# Patient Record
Sex: Female | Born: 1980
Health system: Southern US, Community
[De-identification: ages and names within clinical notes are randomized; demographics above are authoritative.]

## PROBLEM LIST (undated history)

## (undated) DIAGNOSIS — K838 Other specified diseases of biliary tract: Secondary | ICD-10-CM

## (undated) DIAGNOSIS — N200 Calculus of kidney: Secondary | ICD-10-CM

## (undated) DIAGNOSIS — K5909 Other constipation: Secondary | ICD-10-CM

## (undated) DIAGNOSIS — N289 Disorder of kidney and ureter, unspecified: Secondary | ICD-10-CM

## (undated) DIAGNOSIS — Z87442 Personal history of urinary calculi: Secondary | ICD-10-CM

## (undated) DIAGNOSIS — K635 Polyp of colon: Secondary | ICD-10-CM

## (undated) HISTORY — DX: Other constipation: K59.09

## (undated) HISTORY — DX: Other specified diseases of biliary tract: K83.8

## (undated) HISTORY — PX: BUNIONECTOMY: SHX129

## (undated) HISTORY — DX: Calculus of kidney: N20.0

## (undated) HISTORY — DX: Polyp of colon: K63.5

---
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
## 2000-09-23 ENCOUNTER — Encounter (INDEPENDENT_AMBULATORY_CARE_PROVIDER_SITE_OTHER): Payer: Self-pay | Admitting: *Deleted

## 2000-10-04 ENCOUNTER — Encounter: Admission: RE | Admit: 2000-10-04 | Discharge: 2000-10-04 | Payer: Self-pay | Admitting: Family Medicine

## 2001-11-22 ENCOUNTER — Encounter: Admission: RE | Admit: 2001-11-22 | Discharge: 2001-11-22 | Payer: Self-pay | Admitting: Family Medicine

## 2004-03-24 ENCOUNTER — Other Ambulatory Visit: Admission: RE | Admit: 2004-03-24 | Discharge: 2004-03-24 | Payer: Self-pay | Admitting: *Deleted

## 2004-05-21 ENCOUNTER — Other Ambulatory Visit: Admission: RE | Admit: 2004-05-21 | Discharge: 2004-05-21 | Payer: Self-pay | Admitting: Obstetrics & Gynecology

## 2004-08-21 ENCOUNTER — Other Ambulatory Visit: Admission: RE | Admit: 2004-08-21 | Discharge: 2004-08-21 | Payer: Self-pay | Admitting: Obstetrics & Gynecology

## 2005-01-05 ENCOUNTER — Other Ambulatory Visit: Admission: RE | Admit: 2005-01-05 | Discharge: 2005-01-05 | Payer: Self-pay | Admitting: Obstetrics & Gynecology

## 2005-02-23 ENCOUNTER — Other Ambulatory Visit: Admission: RE | Admit: 2005-02-23 | Discharge: 2005-02-23 | Payer: Self-pay | Admitting: Gynecology

## 2005-07-06 ENCOUNTER — Other Ambulatory Visit: Admission: RE | Admit: 2005-07-06 | Discharge: 2005-07-06 | Payer: Self-pay | Admitting: Gynecology

## 2006-01-07 ENCOUNTER — Emergency Department (HOSPITAL_COMMUNITY): Admission: EM | Admit: 2006-01-07 | Discharge: 2006-01-07 | Payer: Self-pay | Admitting: Emergency Medicine

## 2006-10-13 ENCOUNTER — Other Ambulatory Visit: Admission: RE | Admit: 2006-10-13 | Discharge: 2006-10-13 | Payer: Self-pay | Admitting: Gynecology

## 2007-01-21 ENCOUNTER — Encounter (INDEPENDENT_AMBULATORY_CARE_PROVIDER_SITE_OTHER): Payer: Self-pay | Admitting: *Deleted

## 2007-03-30 ENCOUNTER — Other Ambulatory Visit: Admission: RE | Admit: 2007-03-30 | Discharge: 2007-03-30 | Payer: Self-pay | Admitting: Gynecology

## 2010-01-17 DIAGNOSIS — D3A029 Benign carcinoid tumor of the large intestine, unspecified portion: Secondary | ICD-10-CM

## 2010-01-17 HISTORY — DX: Benign carcinoid tumor of the large intestine, unspecified portion: D3A.029

## 2011-02-13 ENCOUNTER — Inpatient Hospital Stay (INDEPENDENT_AMBULATORY_CARE_PROVIDER_SITE_OTHER)
Admission: RE | Admit: 2011-02-13 | Discharge: 2011-02-13 | Disposition: A | Discharge status: Home / Self Care | Payer: Self-pay | Source: Ambulatory Visit | Attending: Emergency Medicine | Admitting: Emergency Medicine

## 2011-02-13 DIAGNOSIS — I889 Nonspecific lymphadenitis, unspecified: Secondary | ICD-10-CM

## 2011-05-19 ENCOUNTER — Emergency Department (HOSPITAL_COMMUNITY): Payer: Self-pay

## 2011-05-19 ENCOUNTER — Emergency Department (HOSPITAL_COMMUNITY)
Admission: EM | Admit: 2011-05-19 | Discharge: 2011-05-19 | Disposition: A | Discharge status: Home / Self Care | Payer: Self-pay | Attending: Emergency Medicine | Admitting: Emergency Medicine

## 2011-05-19 DIAGNOSIS — R109 Unspecified abdominal pain: Secondary | ICD-10-CM | POA: Insufficient documentation

## 2011-05-19 DIAGNOSIS — N201 Calculus of ureter: Secondary | ICD-10-CM | POA: Insufficient documentation

## 2011-05-19 LAB — URINALYSIS, ROUTINE W REFLEX MICROSCOPIC
Ketones, ur: NEGATIVE mg/dL
Nitrite: NEGATIVE
Protein, ur: NEGATIVE mg/dL
pH: 5.5 (ref 5.0–8.0)

## 2011-05-19 LAB — COMPREHENSIVE METABOLIC PANEL
Albumin: 3.7 g/dL (ref 3.5–5.2)
BUN: 17 mg/dL (ref 6–23)
CO2: 26 mEq/L (ref 19–32)
Calcium: 9.3 mg/dL (ref 8.4–10.5)
Chloride: 99 mEq/L (ref 96–112)
Creatinine, Ser: 0.84 mg/dL (ref 0.50–1.10)
GFR calc non Af Amer: >60 mL/min (ref >60)
Total Bilirubin: 0.2 mg/dL — ABNORMAL LOW (ref 0.3–1.2)
Total Protein: 6.8 g/dL (ref 6.0–8.3)

## 2011-05-19 LAB — DIFFERENTIAL
Basophils Absolute: 0 10*3/uL (ref 0.0–0.1)
Eosinophils Absolute: 0.2 10*3/uL (ref 0.0–0.7)
Monocytes Absolute: 0.6 10*3/uL (ref 0.1–1.0)
Neutro Abs: 5.5 10*3/uL (ref 1.7–7.7)

## 2011-05-19 LAB — URINE MICROSCOPIC-ADD ON

## 2011-05-19 LAB — CBC
HCT: 38.3 % (ref 36.0–46.0)
MCH: 29.5 pg (ref 26.0–34.0)
MCHC: 34.5 g/dL (ref 30.0–36.0)
Platelets: 239 10*3/uL (ref 150–400)
WBC: 10.4 10*3/uL (ref 4.0–10.5)

## 2011-05-19 LAB — LIPASE, BLOOD: Lipase: 17 U/L (ref 11–59)

## 2013-11-12 ENCOUNTER — Encounter (HOSPITAL_COMMUNITY): Payer: Self-pay | Admitting: Emergency Medicine

## 2013-11-12 ENCOUNTER — Emergency Department (HOSPITAL_COMMUNITY): Payer: Self-pay

## 2013-11-12 ENCOUNTER — Emergency Department (HOSPITAL_COMMUNITY)
Admission: EM | Admit: 2013-11-12 | Discharge: 2013-11-12 | Disposition: A | Discharge status: Home / Self Care | Payer: Self-pay | Attending: Emergency Medicine | Admitting: Emergency Medicine

## 2013-11-12 DIAGNOSIS — N23 Unspecified renal colic: Secondary | ICD-10-CM

## 2013-11-12 DIAGNOSIS — Z3202 Encounter for pregnancy test, result negative: Secondary | ICD-10-CM | POA: Insufficient documentation

## 2013-11-12 DIAGNOSIS — R110 Nausea: Secondary | ICD-10-CM | POA: Insufficient documentation

## 2013-11-12 DIAGNOSIS — R300 Dysuria: Secondary | ICD-10-CM | POA: Insufficient documentation

## 2013-11-12 DIAGNOSIS — Z87442 Personal history of urinary calculi: Secondary | ICD-10-CM | POA: Insufficient documentation

## 2013-11-12 HISTORY — DX: Disorder of kidney and ureter, unspecified: N28.9

## 2013-11-12 LAB — POCT I-STAT, CHEM 8
BUN: 14 mg/dL (ref 6–23)
Calcium, Ion: 1.3 mmol/L — ABNORMAL HIGH (ref 1.12–1.23)
Chloride: 102 mEq/L (ref 96–112)
Glucose, Bld: 90 mg/dL (ref 70–99)
Sodium: 141 mEq/L (ref 135–145)

## 2013-11-12 LAB — URINE MICROSCOPIC-ADD ON

## 2013-11-12 LAB — URINALYSIS, ROUTINE W REFLEX MICROSCOPIC
Glucose, UA: NEGATIVE mg/dL
Ketones, ur: 15 mg/dL — AB
Nitrite: NEGATIVE
Protein, ur: NEGATIVE mg/dL
pH: 7 (ref 5.0–8.0)

## 2013-11-12 LAB — CBC WITH DIFFERENTIAL/PLATELET
Basophils Absolute: 0 10*3/uL (ref 0.0–0.1)
Eosinophils Absolute: 0.1 10*3/uL (ref 0.0–0.7)
Eosinophils Relative: 1 % (ref 0–5)
HCT: 42.3 % (ref 36.0–46.0)
Lymphocytes Relative: 29 % (ref 12–46)
Lymphs Abs: 2.5 10*3/uL (ref 0.7–4.0)
MCH: 28.7 pg (ref 26.0–34.0)
Monocytes Relative: 5 % (ref 3–12)
Platelets: 241 10*3/uL (ref 150–400)
WBC: 8.6 10*3/uL (ref 4.0–10.5)

## 2013-11-12 LAB — PREGNANCY, URINE: Preg Test, Ur: NEGATIVE

## 2013-11-12 MED ORDER — MORPHINE SULFATE 4 MG/ML IJ SOLN: 4.0000 mg | INTRAMUSCULAR | Status: DC | PRN

## 2013-11-12 MED ORDER — HYDROMORPHONE HCL PF 1 MG/ML IJ SOLN
1.0000 mg | INTRAMUSCULAR | Status: AC | PRN
  Administered 2013-11-12 (×2): 1 mg via INTRAVENOUS
  Filled 2013-11-12 (×2): qty 1

## 2013-11-12 MED ORDER — OXYCODONE-ACETAMINOPHEN 5-325 MG PO TABS: 2.0000 | ORAL_TABLET | ORAL | Status: DC | PRN

## 2013-11-12 MED ORDER — ONDANSETRON 4 MG PO TBDP: 4.0000 mg | ORAL_TABLET | Freq: Three times a day (TID) | ORAL | Status: DC | PRN

## 2013-11-12 MED ORDER — ONDANSETRON HCL 4 MG/2ML IJ SOLN
4.0000 mg | Freq: Once | INTRAMUSCULAR | Status: AC
  Administered 2013-11-12: 4 mg via INTRAVENOUS
  Filled 2013-11-12: qty 2

## 2013-11-12 MED ORDER — TAMSULOSIN HCL 0.4 MG PO CAPS: 0.4000 mg | ORAL_CAPSULE | Freq: Every day | ORAL | Status: DC

## 2013-11-12 MED ORDER — TAMSULOSIN HCL 0.4 MG PO CAPS
0.4000 mg | ORAL_CAPSULE | Freq: Once | ORAL | Status: AC
  Administered 2013-11-12: 0.4 mg via ORAL
  Filled 2013-11-12: qty 1

## 2013-11-12 MED ORDER — SODIUM CHLORIDE 0.9 % IV BOLUS (SEPSIS)
1000.0000 mL | Freq: Once | INTRAVENOUS | Status: AC
  Administered 2013-11-12: 1000 mL via INTRAVENOUS

## 2013-11-12 MED ORDER — KETOROLAC TROMETHAMINE 30 MG/ML IJ SOLN
30.0000 mg | Freq: Once | INTRAMUSCULAR | Status: AC
  Administered 2013-11-12: 30 mg via INTRAVENOUS
  Filled 2013-11-12: qty 1

## 2013-11-12 NOTE — ED Provider Notes (Signed)
CSN: [id number]     Arrival date & time 11/12/13  1116 History   First MD Initiated Contact with Patient 11/12/13 1142     Chief Complaint  Patient presents with  . Flank Pain  . Abdominal Pain    HPI  Patient presents with left flank pain. Started yesterday evening in her left leg radiates to the left mid abdomen. Now somewhat into her groin. Had a previous episode one year ago diagnosed with ureteral stones. Had a small distal stone. She was able to pass without difficulty. His pain is quite similar. Nausea no vomiting. No fevers. Relation use urinate but cannot.  Past Medical History  Diagnosis Date  . Renal disorder     renal calculi   History reviewed. No pertinent past surgical history. No family history on file. History  Substance Use Topics  . Smoking status: Never Smoker   . Smokeless tobacco: Not on file  . Alcohol Use: No   OB History   Grav Para Term Preterm Abortions TAB SAB Ect Mult Living                 Review of Systems  Constitutional: Negative for fever, chills, diaphoresis, appetite change and fatigue.  HENT: Negative for mouth sores, sore throat and trouble swallowing.   Eyes: Negative for visual disturbance.  Respiratory: Negative for cough, chest tightness, shortness of breath and wheezing.   Cardiovascular: Negative for chest pain.  Gastrointestinal: Negative for nausea, vomiting, abdominal pain, diarrhea and abdominal distention.  Endocrine: Negative for polydipsia, polyphagia and polyuria.  Genitourinary: Positive for flank pain and difficulty urinating. Negative for dysuria, frequency and hematuria.  Musculoskeletal: Negative for gait problem.  Skin: Negative for color change, pallor and rash.  Neurological: Negative for dizziness, syncope, light-headedness and headaches.  Hematological: Does not bruise/bleed easily.  Psychiatric/Behavioral: Negative for behavioral problems and confusion.    Allergies  Amoxicillin  Home Medications    Current Outpatient Rx  Name  Route  Sig  Dispense  Refill  . ibuprofen (ADVIL,MOTRIN) 200 MG tablet   Oral   Take 800 mg by mouth every 6 (six) hours as needed for moderate pain.         Marland Kitchen ondansetron (ZOFRAN ODT) 4 MG disintegrating tablet   Oral   Take 1 tablet (4 mg total) by mouth every 8 (eight) hours as needed for nausea.   20 tablet   0   . oxyCODONE-acetaminophen (PERCOCET/ROXICET) 5-325 MG per tablet   Oral   Take 2 tablets by mouth every 4 (four) hours as needed.   30 tablet   0   . tamsulosin (FLOMAX) 0.4 MG CAPS capsule   Oral   Take 1 capsule (0.4 mg total) by mouth daily.   7 capsule   0    BP 105/73  Pulse 72  Temp(Src) 97.9 F (36.6 C) (Oral)  Resp 16  Wt 224 lb 6.4 oz (101.787 kg)  SpO2 99%  LMP 11/05/2013 Physical Exam  Constitutional: She is oriented to person, place, and time. She appears well-developed and well-nourished. No distress.  Appears uncomfortable  HENT:  Head: Normocephalic.  Eyes: Conjunctivae are normal. Pupils are equal, round, and reactive to light. No scleral icterus.  Neck: Normal range of motion. Neck supple. No thyromegaly present.  Cardiovascular: Normal rate and regular rhythm.  Exam reveals no gallop and no friction rub.   No murmur heard. Pulmonary/Chest: Effort normal and breath sounds normal. No respiratory distress. She has no wheezes. She has  no rales.  Abdominal: Soft. Bowel sounds are normal. She exhibits no distension. There is no tenderness. There is no rebound.    Musculoskeletal: Normal range of motion.       Arms: Neurological: She is alert and oriented to person, place, and time.  Skin: Skin is warm and dry. No rash noted.  Psychiatric: She has a normal mood and affect. Her behavior is normal.    ED Course  Procedures (including critical care time) Labs Review Labs Reviewed  URINALYSIS, ROUTINE W REFLEX MICROSCOPIC - Abnormal; Notable for the following:    Color, Urine AMBER (*)    APPearance  CLOUDY (*)    Specific Gravity, Urine 1.035 (*)    Hgb urine dipstick SMALL (*)    Ketones, ur 15 (*)    All other components within normal limits  POCT I-STAT, CHEM 8 - Abnormal; Notable for the following:    Calcium, Ion 1.30 (*)    All other components within normal limits  CBC WITH DIFFERENTIAL  PREGNANCY, URINE  URINE MICROSCOPIC-ADD ON   Imaging Review Ct Abdomen Pelvis Wo Contrast  11/12/2013   CLINICAL DATA:  Left flank/left lower quadrant pain  EXAM: CT ABDOMEN AND PELVIS WITHOUT CONTRAST  TECHNIQUE: Multidetector CT imaging of the abdomen and pelvis was performed following the standard protocol without intravenous contrast.  COMPARISON:  05/19/2011  FINDINGS: Lung bases are clear.  Unenhanced liver, spleen, pancreas, and adrenal glands are within normal limits.  Gallbladder is unremarkable. No intrahepatic or extrahepatic ductal dilatation.  Right kidney is within normal limits. Left kidney is notable for perinephric stranding. No renal calculi. Fullness of the left renal collecting system without frank hydronephrosis.  No evidence of abdominal aortic aneurysm.  No abdominopelvic ascites.  No suspicious abdominopelvic lymphadenopathy.  Uterus and bilateral ovaries are unremarkable.  3 mm distal left ureteral calculus just above the UVJ (series 2/image 84).  Bladder is underdistended but unremarkable.  Visualized osseous structures are within normal limits.  IMPRESSION: 3 mm distal left ureteral calculus.  Fullness of the left renal collecting system without frank hydronephrosis. Mild left perinephric stranding/edema.   Electronically Signed   By: Charline Bills M.D.   On: 11/12/2013 13:25    EKG Interpretation   None       MDM   1. Ureteral colic    CT shows small 3 mm distal ureteral stone with mild hydronephrosis. She is tolerating symptoms well after IV pain meds. She be placed on Flomax. Plan pain control anti-inflammatories antiemetics strain her urine, stay hydrated ,  urological followup if not improving.   Roney Marion, MD 11/12/13 [phone]

## 2013-11-12 NOTE — ED Notes (Signed)
Patient transported to CT 

## 2013-11-12 NOTE — ED Notes (Signed)
Pt with hx of kidney stone to ED c/o L flank pain and LLQ pain with difficulty urinating.  Sates if feels like the last time she had a kidney stone.  Denies nausea.

## 2014-02-14 ENCOUNTER — Encounter (HOSPITAL_COMMUNITY): Payer: Self-pay | Admitting: Emergency Medicine

## 2014-02-14 ENCOUNTER — Other Ambulatory Visit (HOSPITAL_COMMUNITY)
Admission: RE | Admit: 2014-02-14 | Discharge: 2014-02-14 | Disposition: A | Discharge status: Home / Self Care | Payer: Self-pay | Source: Ambulatory Visit | Attending: Family Medicine | Admitting: Family Medicine

## 2014-02-14 ENCOUNTER — Emergency Department (INDEPENDENT_AMBULATORY_CARE_PROVIDER_SITE_OTHER)
Admission: EM | Admit: 2014-02-14 | Discharge: 2014-02-14 | Disposition: A | Discharge status: Home / Self Care | Payer: Self-pay | Source: Home / Self Care | Attending: Family Medicine | Admitting: Family Medicine

## 2014-02-14 DIAGNOSIS — Z113 Encounter for screening for infections with a predominantly sexual mode of transmission: Secondary | ICD-10-CM | POA: Insufficient documentation

## 2014-02-14 DIAGNOSIS — N760 Acute vaginitis: Secondary | ICD-10-CM

## 2014-02-14 LAB — CERVICOVAGINAL ANCILLARY ONLY
Wet Prep (BD Affirm): NEGATIVE
Wet Prep (BD Affirm): NEGATIVE
Wet Prep (BD Affirm): POSITIVE — AB

## 2014-02-14 LAB — POCT URINALYSIS DIP (DEVICE)
Bilirubin Urine: NEGATIVE
Glucose, UA: NEGATIVE mg/dL
KETONES UR: NEGATIVE mg/dL
LEUKOCYTES UA: NEGATIVE
Nitrite: NEGATIVE
PROTEIN: NEGATIVE mg/dL
Specific Gravity, Urine: >=1.03 (ref 1.005–1.030)
UROBILINOGEN UA: 0.2 mg/dL (ref 0.0–1.0)
pH: 6 (ref 5.0–8.0)

## 2014-02-14 LAB — POCT PREGNANCY, URINE: PREG TEST UR: NEGATIVE

## 2014-02-14 MED ORDER — METRONIDAZOLE 500 MG PO TABS: 500.0000 mg | ORAL_TABLET | Freq: Two times a day (BID) | ORAL | Status: DC

## 2014-02-14 NOTE — ED Provider Notes (Signed)
Caitlin Ingram is a 33 y.o. female who presents to Urgent Care today for vaginal discharge. Yellowish to whitish discharge with foul odor. This has been present for 1 week. No vaginal irritation, or abdominal pain or dysuria or urinary frequency or urgency. She has frequent episodes of bacterial vaginosis. The current symptoms are consistent with prior episodes. No nausea vomiting or diarrhea.   Past Medical History  Diagnosis Date  . Renal disorder     renal calculi   History  Substance Use Topics  . Smoking status: Never Smoker   . Smokeless tobacco: Not on file  . Alcohol Use: No   ROS as above Medications: No current facility-administered medications for this encounter.   Current Outpatient Prescriptions  Medication Sig Dispense Refill  . metroNIDAZOLE (FLAGYL) 500 MG tablet Take 1 tablet (500 mg total) by mouth 2 (two) times daily.  14 tablet  3    Exam:  BP 124/86  Pulse 60  Temp(Src) 98 F (36.7 C) (Oral)  Resp 18  SpO2 99%  LMP 02/06/2014 Gen: Well NAD HEENT: EOMI,  MMM Lungs: Normal work of breathing. CTABL Heart: RRR no MRG Abd: NABS, Soft. NT, ND Exts: Brisk capillary refill, warm and well perfused.  GYN: Normal external genitalia. Normal vaginal canal with thin white discharge normal-appearing cervix.  Results for orders placed during the hospital encounter of 02/14/14 (from the past 24 hour(s))  POCT URINALYSIS DIP (DEVICE)     Status: Abnormal   Collection Time    02/14/14  1:12 PM      Result Value Ref Range   Glucose, UA NEGATIVE  NEGATIVE mg/dL   Bilirubin Urine NEGATIVE  NEGATIVE   Ketones, ur NEGATIVE  NEGATIVE mg/dL   Specific Gravity, Urine >=1.030  1.005 - 1.030   Hgb urine dipstick TRACE (*) NEGATIVE   pH 6.0  5.0 - 8.0   Protein, ur NEGATIVE  NEGATIVE mg/dL   Urobilinogen, UA 0.2  0.0 - 1.0 mg/dL   Nitrite NEGATIVE  NEGATIVE   Leukocytes, UA NEGATIVE  NEGATIVE  POCT PREGNANCY, URINE     Status: None   Collection Time    02/14/14   1:15 PM      Result Value Ref Range   Preg Test, Ur NEGATIVE  NEGATIVE   No results found.  Assessment and Plan: 33 y.o. female with vaginitis. Likely BV. Treat with flagyl. Cytology pending.    Discussed warning signs or symptoms. Please see discharge instructions. Patient expresses understanding.    Gregor Hams, MD 02/14/14 [phone]

## 2014-02-14 NOTE — Discharge Instructions (Signed)
Thank you for coming in today. Take flagyl twice daily for 1 week.  Come back as needed.  Call or go to the emergency room if you get worse, have trouble breathing, have chest pains, or palpitations.   Bacterial Vaginosis Bacterial vaginosis is a vaginal infection that occurs when the normal balance of bacteria in the vagina is disrupted. It results from an overgrowth of certain bacteria. This is the most common vaginal infection in women of childbearing age. Treatment is important to prevent complications, especially in pregnant women, as it can cause a premature delivery. CAUSES  Bacterial vaginosis is caused by an increase in harmful bacteria that are normally present in smaller amounts in the vagina. Several different kinds of bacteria can cause bacterial vaginosis. However, the reason that the condition develops is not fully understood. RISK FACTORS Certain activities or behaviors can put you at an increased risk of developing bacterial vaginosis, including:  Having a new sex partner or multiple sex partners.  Douching.  Using an intrauterine device (IUD) for contraception. Women do not get bacterial vaginosis from toilet seats, bedding, swimming pools, or contact with objects around them. SIGNS AND SYMPTOMS  Some women with bacterial vaginosis have no signs or symptoms. Common symptoms include:  Grey vaginal discharge.  A fishlike odor with discharge, especially after sexual intercourse.  Itching or burning of the vagina and vulva.  Burning or pain with urination. DIAGNOSIS  Your health care provider will take a medical history and examine the vagina for signs of bacterial vaginosis. A sample of vaginal fluid may be taken. Your health care provider will look at this sample under a microscope to check for bacteria and abnormal cells. A vaginal pH test may also be done.  TREATMENT  Bacterial vaginosis may be treated with antibiotic medicines. These may be given in the form of a  pill or a vaginal cream. A second round of antibiotics may be prescribed if the condition comes back after treatment.  HOME CARE INSTRUCTIONS   Only take over-the-counter or prescription medicines as directed by your health care provider.  If antibiotic medicine was prescribed, take it as directed. Make sure you finish it even if you start to feel better.  Do not have sex until treatment is completed.  Tell all sexual partners that you have a vaginal infection. They should see their health care provider and be treated if they have problems, such as a mild rash or itching.  Practice safe sex by using condoms and only having one sex partner. SEEK MEDICAL CARE IF:   Your symptoms are not improving after 3 days of treatment.  You have increased discharge or pain.  You have a fever. MAKE SURE YOU:   Understand these instructions.  Will watch your condition.  Will get help right away if you are not doing well or get worse. FOR MORE INFORMATION  Centers for Disease Control and Prevention, Division of STD Prevention: AppraiserFraud.fi American Sexual Health Association (ASHA): www.ashastd.org  Document Released: 11/09/2005 Document Revised: 08/30/2013 Document Reviewed: 06/21/2013 [facility] Patient Information 2014 [place].   [address] [place], [place] [phone]  [facility] [address] [place], [place] [phone]  [facility]: Delsa Bern MD [place], [place] [phone]  [facility] [place], [place] [phone]  [facility]: Dian Queen MD [address] [place], [place] [phone]  [facility]:  [place], [place] [phone]  [address] [place], [place] [phone]  [facility]: [address], [place], [place] [address] [phone]

## 2014-02-14 NOTE — ED Notes (Signed)
C/o bacterial or yeast infection States she does have a yellowish/greenish discharge with odor Denies any urinary problems

## 2014-02-15 LAB — CERVICOVAGINAL ANCILLARY ONLY
CHLAMYDIA, DNA PROBE: NEGATIVE
Neisseria Gonorrhea: NEGATIVE

## 2014-02-16 NOTE — ED Notes (Signed)
GC/Chlamydia neg., Affirm: Candida and Trich neg., Gardnerella pos. Pt. adequately treated with Flagyl. Roselyn Meier 02/16/2014

## 2017-02-02 ENCOUNTER — Encounter (HOSPITAL_COMMUNITY): Payer: Self-pay | Admitting: Emergency Medicine

## 2017-02-02 ENCOUNTER — Emergency Department (HOSPITAL_COMMUNITY)
Admission: EM | Admit: 2017-02-02 | Discharge: 2017-02-02 | Disposition: A | Discharge status: Home / Self Care | Payer: 59 | Attending: Emergency Medicine | Admitting: Emergency Medicine

## 2017-02-02 DIAGNOSIS — F17290 Nicotine dependence, other tobacco product, uncomplicated: Secondary | ICD-10-CM | POA: Diagnosis not present

## 2017-02-02 DIAGNOSIS — M542 Cervicalgia: Secondary | ICD-10-CM | POA: Diagnosis not present

## 2017-02-02 NOTE — ED Triage Notes (Signed)
Pt to ED c/o LEFT anterior neck/throat pain and swelling. Pt states she had a surgery on her neck when she was 36 years old (removed a cyst) and has felt a "knot" for 2 days. She states it doesn't affect her breathing or airway, talking in full sentences, no dysphagia or dysarthria. Pt wants to get it checked out to see if the cyst has come back. Resp e/u. No changes in voice.

## 2017-02-02 NOTE — ED Provider Notes (Signed)
[facility] DEPT Provider Note   CSN: [id number] Arrival date & time: 02/02/17  1607   By signing my name below, I, Soijett Blue, attest that this documentation has been prepared under the direction and in the presence of Daleen Bo, MD. Electronically Signed: [place], ED Scribe. 02/02/17. 6:53 PM.  History   Chief Complaint Chief Complaint  Patient presents with  . Neck Pain    HPI Caitlin Ingram is a 36 y.o. female who presents to the Emergency Department complaining of left anterior neck pain onset 2 days ago. Pt reports associated "lump" to right anterior neck x 1 week ago. Pt has not tried any medications for the relief of her symptoms. She notes that her left anterior neck pain is worsened with laying down and certain head movements. She reports that she had a cyst removal completed to her neck in 1990 and voices concern for if she has another cyst. Pt states that the area where the cyst was located was tender to the touch and she notes that the surgery was completed in [place]. She denies painful swallowing, trouble swallowing, fever, chills, vomiting, SOB, CP, dizziness, and any other symptoms. Denies medical issues or taking daily medications. She states that she works in a [employer].    The history is provided by the patient. No language interpreter was used.    Past Medical History:  Diagnosis Date  . Renal disorder    renal calculi    There are no active problems to display for this patient.   Past Surgical History:  Procedure Laterality Date  . CYST REMOVAL NECK  1990    OB History    No data available       Home Medications    Prior to Admission medications   Medication Sig Start Date End Date Taking? Authorizing Provider  metroNIDAZOLE (FLAGYL) 500 MG tablet Take 1 tablet (500 mg total) by mouth 2 (two) times daily. 02/14/14   Gregor Hams, MD    Family History No family history on file.  Social History Social History    Substance Use Topics  . Smoking status: Current Every Day Smoker    Types: Cigars  . Smokeless tobacco: Never Used  . Alcohol use No     Allergies   Amoxicillin   Review of Systems Review of Systems  Constitutional: Negative for chills and fever.  HENT: Negative for trouble swallowing.        No painful swallowing. +"Lump" to right sided neck  Respiratory: Negative for shortness of breath.   Cardiovascular: Negative for chest pain.  Gastrointestinal: Negative for vomiting.  Musculoskeletal: Positive for neck pain.  Neurological: Negative for dizziness.  All other systems reviewed and are negative.    Physical Exam Updated Vital Signs BP 137/94 (BP Location: Left Arm)   Pulse 67   Temp 98.2 F (36.8 C) (Oral)   Resp 18   Ht 5\' 6"  (1.676 m)   Wt 225 lb (102.1 kg)   LMP 01/04/2017 (Approximate)   SpO2 100%   BMI 36.32 kg/m   Physical Exam  Constitutional: She is oriented to person, place, and time. She appears well-developed and well-nourished.  HENT:  Head: Normocephalic and atraumatic.  Mouth/Throat: Uvula is midline, oropharynx is clear and moist and mucous membranes are normal. No oral lesions. No trismus in the jaw.  Eyes: Conjunctivae and EOM are normal. Pupils are equal, round, and reactive to light.  Neck: Normal range of motion and phonation normal. Neck  supple.  1 x 1.5 cm mobile lymph at the right lower SCM. 1 x 1 cm lymphnode in the area of tenderness to left upper neck. No other worrisome mass or swelling in the neck. No stridor.   Cardiovascular: Normal rate.   Pulmonary/Chest: Effort normal. No stridor. She exhibits no tenderness.  Musculoskeletal: Normal range of motion.  Neurological: She is alert and oriented to person, place, and time. She exhibits normal muscle tone.  Skin: Skin is warm and dry.  Psychiatric: She has a normal mood and affect. Her behavior is normal. Judgment and thought content normal.  Nursing note and vitals  reviewed.    ED Treatments / Results  DIAGNOSTIC STUDIES: Oxygen Saturation is 99% on RA, nl by my interpretation.    COORDINATION OF CARE: 6:46 PM Discussed treatment plan with pt at bedside which includes referral and follow up with ENT specialist, warm compresses, ibuprofen, and pt agreed to plan.   Procedures Procedures (including critical care time)  Medications Ordered in ED Medications - No data to display   Initial Impression / Assessment and Plan / ED Course  I have reviewed the triage vital signs and the nursing notes.   Medications - No data to display  No data found.   -At discharge- reevaluation with update and discussion. After initial assessment and treatment, an updated evaluation reveals patient is comfortable and has no further complaints,  findings discussed with the patient and all questions answered. Siddhi Dornbush L    Final Clinical Impressions(s) / ED Diagnoses   Final diagnoses:  Neck pain   Nonspecific neck pain with adenopathy which is nonspecific.  The patient has concern for recurrent "cyst" of neck.  At this time with short duration of symptoms, is nonspecific, I doubt recurrence of a soft tissue neck tumor.  There is no clinical evidence for brachial cleft cyst, tracheal deformity, stridor, airway impingement, or significant musculoskeletal abnormality.  The patient is stable for discharge with outpatient expectant management.  Nursing Notes Reviewed/ Care Coordinated Applicable Imaging Reviewed Interpretation of Laboratory Data incorporated into ED treatment  The patient appears reasonably screened and/or stabilized for discharge and I doubt any other medical condition or other [facility] requiring further screening, evaluation, or treatment in the ED at this time prior to discharge.  Plan: Home Medications-OTC analgesia as needed; Home Treatments-consider heat applications for comfort.; return here if the recommended treatment, does not improve the  symptoms; Recommended follow up-ENT follow-up if not better or having further concerns after 1 week.    New Prescriptions Discharge Medication List as of 02/02/2017  7:06 PM     I personally performed the services described in this documentation, which was scribed in my presence. The recorded information has been reviewed and is accurate     Daleen Bo, MD 02/04/17 1032

## 2017-02-02 NOTE — Discharge Instructions (Signed)
There is no clear cause, for the pain you are having in her neck.  Try using heat on the sore area 3 or 4 times a day and taking ibuprofen, for pain.  If you feel like the neck is swelling, or have pain that lasts more than 1 week follow-up with the ENT physician.

## 2017-06-01 ENCOUNTER — Encounter (HOSPITAL_COMMUNITY): Payer: Self-pay | Admitting: Emergency Medicine

## 2017-06-01 ENCOUNTER — Ambulatory Visit (HOSPITAL_COMMUNITY)
Admission: EM | Admit: 2017-06-01 | Discharge: 2017-06-01 | Disposition: A | Discharge status: Home / Self Care | Payer: 59 | Attending: Family Medicine | Admitting: Family Medicine

## 2017-06-01 DIAGNOSIS — R599 Enlarged lymph nodes, unspecified: Secondary | ICD-10-CM

## 2017-06-01 MED ORDER — CEPHALEXIN 500 MG PO CAPS: 500.0000 mg | ORAL_CAPSULE | Freq: Four times a day (QID) | ORAL | 0 refills | Status: DC

## 2017-06-01 NOTE — Discharge Instructions (Signed)
Return if symptoms persist past one week

## 2017-06-01 NOTE — ED Triage Notes (Signed)
Lymph nodes in neck are sore to palpation for a week.  Denies ear pain.

## 2017-06-01 NOTE — ED Provider Notes (Signed)
CSN: [id number]     Arrival date & time 06/01/17  1718 History   None    Chief Complaint  Patient presents with  . Lymphadenopathy   (Consider location/radiation/quality/duration/timing/severity/associated sxs/prior Treatment) The history is provided by the patient. No language interpreter was used.    Past Medical History:  Diagnosis Date  . Renal disorder    renal calculi   Past Surgical History:  Procedure Laterality Date  . CYST REMOVAL NECK  1990   No family history on file. Social History  Substance Use Topics  . Smoking status: Current Every Day Smoker    Types: Cigars  . Smokeless tobacco: Never Used  . Alcohol use No   OB History    No data available     Review of Systems  All other systems reviewed and are negative.   Allergies  Amoxicillin  Home Medications   Prior to Admission medications   Medication Sig Start Date End Date Taking? Authorizing Provider  ibuprofen (ADVIL,MOTRIN) 200 MG tablet Take 200 mg by mouth every 6 (six) hours as needed.   Yes [provider]  metroNIDAZOLE (FLAGYL) 500 MG tablet Take 1 tablet (500 mg total) by mouth 2 (two) times daily. 02/14/14   Gregor Hams, MD   Meds Ordered and Administered this Visit  Medications - No data to display  BP 122/83 (BP Location: Right Arm) Comment (BP Location): large cuff  Pulse (!) 6   Temp 98.5 F (36.9 C) (Oral)   Resp 18   LMP 05/09/2017   SpO2 100%  No data found.   Physical Exam  Constitutional: She appears well-developed and well-nourished.  HENT:  Head: Normocephalic.  Right Ear: External ear normal.  Mouth/Throat: Oropharynx is clear and moist.  Eyes: Conjunctivae and EOM are normal. Pupils are equal, round, and reactive to light.  Neck: Normal range of motion.  Swollen lymph node in front of right ear, tender to palpation  Cardiovascular: Normal rate.   Pulmonary/Chest: Effort normal.  Abdominal: Soft.  Musculoskeletal: Normal range of motion.   Neurological: She is alert.  Skin: Skin is warm.  Psychiatric: She has a normal mood and affect.  Nursing note and vitals reviewed.   Urgent Care Course     Procedures (including critical care time)  Labs Review Labs Reviewed - No data to display  Imaging Review No results found.   Visual Acuity Review  Right Eye Distance:   Left Eye Distance:   Bilateral Distance:    Right Eye Near:   Left Eye Near:    Bilateral Near:         MDM    1. Lymph node enlargement    An After Visit Summary was printed and given to the patient. Meds ordered this encounter  Medications  . ibuprofen (ADVIL,MOTRIN) 200 MG tablet    Sig: Take 200 mg by mouth every 6 (six) hours as needed.  . cephALEXin (KEFLEX) 500 MG capsule    Sig: Take 1 capsule (500 mg total) by mouth 4 (four) times daily.    Dispense:  40 capsule    Refill:  0    Order Specific Question:   Supervising Provider    Answer:   Shaune Spittle, PA-C 06/02/17 1117

## 2018-03-03 DIAGNOSIS — M25561 Pain in right knee: Secondary | ICD-10-CM | POA: Diagnosis not present

## 2018-03-24 DIAGNOSIS — M25561 Pain in right knee: Secondary | ICD-10-CM | POA: Diagnosis not present

## 2018-08-19 DIAGNOSIS — Z0100 Encounter for examination of eyes and vision without abnormal findings: Secondary | ICD-10-CM | POA: Diagnosis not present

## 2019-01-09 ENCOUNTER — Ambulatory Visit: Payer: 59 | Admitting: Podiatry

## 2019-01-10 ENCOUNTER — Encounter: Payer: Self-pay | Admitting: Podiatry

## 2019-01-10 ENCOUNTER — Ambulatory Visit (INDEPENDENT_AMBULATORY_CARE_PROVIDER_SITE_OTHER): Payer: 59

## 2019-01-10 ENCOUNTER — Ambulatory Visit: Payer: 59 | Admitting: Podiatry

## 2019-01-10 DIAGNOSIS — M2041 Other hammer toe(s) (acquired), right foot: Secondary | ICD-10-CM | POA: Diagnosis not present

## 2019-01-10 DIAGNOSIS — M2011 Hallux valgus (acquired), right foot: Secondary | ICD-10-CM

## 2019-01-10 DIAGNOSIS — M2042 Other hammer toe(s) (acquired), left foot: Secondary | ICD-10-CM

## 2019-01-10 DIAGNOSIS — M2012 Hallux valgus (acquired), left foot: Secondary | ICD-10-CM | POA: Diagnosis not present

## 2019-01-10 NOTE — Progress Notes (Signed)
   Subjective:    Patient ID: Caitlin Ingram, female    DOB: Nov 12, 1981, 38 y.o.   MRN: [id number]  HPI 38 year old female presents the office today for concerns of painful bunions both of her feet with the left side worse than the right as well as painful left fifth toe.  She states she previously did break her left fifth toe.  She states that she has tried changing shoes and was made the bunions worse.  She does have a strong family history of having bunions and she was to have surgery when she was younger but she never did.  The bunions have progressed.  She also states the left fifth toes become painful with certain shoes.  She is tried changing shoes without any significant improvement.  She has no other concerns today.   Review of Systems  All other systems reviewed and are negative.  Past Medical History:  Diagnosis Date  . Renal disorder    renal calculi    Past Surgical History:  Procedure Laterality Date  . CYST REMOVAL NECK  1990    No current outpatient medications on file.  Allergies  Allergen Reactions  . Amoxicillin Hives       Objective:   Physical Exam General: AAO x3, NAD  Dermatological: Skin is warm, dry and supple bilateral. Nails x 10 are well manicured; remaining integument appears unremarkable at this time. There are no open sores, no preulcerative lesions, no rash or signs of infection present.  Vascular: Dorsalis Pedis artery and Posterior Tibial artery pedal pulses are 2/4 bilateral with immedate capillary fill time.  There is no pain with calf compression, swelling, warmth, erythema.   Neruologic: Grossly intact via light touch bilateral.  Protective threshold with Semmes Wienstein monofilament intact to all pedal sites bilateral.   Musculoskeletal: Severe bunion deformities present bilaterally left side worse than the right.  There is hypermobility present of the first ray.  Mild hammertoe contractures are present there is adductovarus present  the fifth toe and causing discomfort on the PIPJ.  There is tenderness directly on the bunion sites.  Small callus formation present on the left first MPJ.  Muscular strength 5/5 in all groups tested bilateral.  Gait: Unassisted, Nonantalgic.     Assessment & Plan:  38 year old female with severe bunion deformities bilaterally hammertoe deformity causing pain to the left fifth toe -Treatment options discussed including all alternatives, risks, and complications -Etiology of symptoms were discussed -X-rays were obtained and reviewed with the patient.  There is no evidence of acute fracture.  Severe bunion deformities present.  Hammertoes are present.   Today we a long discussion regards to surgical as well as conservative care.  Regards to conservative treatments we discussed offloading, padding.  Discussed shoe modifications and orthotics.  Ultimately given the severity of her bunion she wants to consider surgery.  We discussed with her a Lapidus bunionectomy and possible fifth toe arthroplasty.  We discussed the surgery as well as postoperative course.  She will consider her options and discussed with her job.  I gave her the information to contact her surgery scheduler should she want to proceed with surgery.  In the meantime we will continue with conservative care.  Trula Slade DPM

## 2019-01-10 NOTE — Patient Instructions (Addendum)
The procedure for your bunions we talked about is called a Lapidus Bunionectomy  If was nice to meet you today. If you have any questions or any further concerns, please feel fee to give me a call. You can call our office at [phone] or please feel fee to send me a message through [place].    Bunion  A bunion is a bump on the base of the big toe that forms when the bones of the big toe joint move out of position. Bunions may be small at first, but they often get larger over time. They can make walking painful. What are the causes? A bunion may be caused by:  Wearing narrow or pointed shoes that force the big toe to press against the other toes.  Abnormal foot development that causes the foot to roll inward (pronate).  Changes in the foot that are caused by certain diseases, such as rheumatoid arthritis or polio.  A foot injury. What increases the risk? The following factors may make you more likely to develop this condition:  Wearing shoes that squeeze the toes together.  Having certain diseases, such as: ? Rheumatoid arthritis. ? Polio. ? Cerebral palsy.  Having family members who have bunions.  Being born with a foot deformity, such as flat feet or low arches.  Doing activities that put a lot of pressure on the feet, such as ballet dancing. What are the signs or symptoms? The main symptom of a bunion is a noticeable bump on the big toe. Other symptoms may include:  Pain.  Swelling around the big toe.  Redness and inflammation.  Thick or hardened skin on the big toe or between the toes.  Stiffness or loss of motion in the big toe.  Trouble with walking. How is this diagnosed? A bunion may be diagnosed based on your symptoms, medical history, and activities. You may have tests, such as:  X-rays. These allow your health care provider to check the position of the bones in your foot and look for damage to your joint. They also help your health care provider  determine the severity of your bunion and the best way to treat it.  Joint aspiration. In this test, a sample of fluid is removed from the toe joint. This test may be done if you are in a lot of pain. It helps rule out diseases that cause painful swelling of the joints, such as arthritis. How is this treated? Treatment depends on the severity of your symptoms. The goal of treatment is to relieve symptoms and prevent the bunion from getting worse. Your health care provider may recommend:  Wearing shoes that have a wide toe box.  Using bunion pads to cushion the affected area.  Taping your toes together to keep them in a normal position.  Placing a device inside your shoe (orthotics) to help reduce pressure on your toe joint.  Taking medicine to ease pain, inflammation, and swelling.  Applying heat or ice to the affected area.  Doing stretching exercises.  Surgery to remove scar tissue and move the toes back into their normal position. This treatment is rare. Follow these instructions at home: Managing pain, stiffness, and swelling   If directed, put ice on the painful area: ? Put ice in a plastic bag. ? Place a towel between your skin and the bag. ? Leave the ice on for 20 minutes, 2-3 times a day. Activity   If directed, apply heat to the affected area before you exercise. Use  the heat source that your health care provider recommends, such as a moist heat pack or a heating pad. ? Place a towel between your skin and the heat source. ? Leave the heat on for 20-30 minutes. ? Remove the heat if your skin turns bright red. This is especially important if you are unable to feel pain, heat, or cold. You may have a greater risk of getting burned.  Do exercises as told by your health care provider. General instructions  Support your toe joint with proper footwear, shoe padding, or taping as told by your health care provider.  Take over-the-counter and prescription medicines only as  told by your health care provider.  Keep all follow-up visits as told by your health care provider. This is important. Contact a health care provider if your symptoms:  Get worse.  Do not improve in 2 weeks. Get help right away if you have:  Severe pain and trouble with walking. Summary  A bunion is a bump on the base of the big toe that forms when the bones of the big toe joint move out of position.  Bunions can make walking painful.  Treatment depends on the severity of your symptoms.  Support your toe joint with proper footwear, shoe padding, or taping as told by your health care provider. This information is not intended to replace advice given to you by your health care provider. Make sure you discuss any questions you have with your health care provider. Document Released: 11/09/2005 Document Revised: 03/22/2018 Document Reviewed: 03/22/2018 Elsevier Interactive Patient Education  2019 [employer].

## 2019-01-11 DIAGNOSIS — M2012 Hallux valgus (acquired), left foot: Principal | ICD-10-CM | POA: Insufficient documentation

## 2019-01-11 DIAGNOSIS — M2011 Hallux valgus (acquired), right foot: Secondary | ICD-10-CM | POA: Insufficient documentation

## 2019-01-18 ENCOUNTER — Telehealth: Payer: Self-pay | Admitting: *Deleted

## 2019-01-18 NOTE — Telephone Encounter (Signed)
"  I'm calling to schedule my surgery with Dr. Jacqualyn Posey."  Have you signed your consent forms for the surgery?  "No, he just gave me a pamphlet and told me to look over it and to call once I decided what I wanted to do."  You need to schedule an appointment to see him for a consultation.  I'll transfer you to a scheduler's voicemail.  Leave her a message and she will call you tomorrow and schedule you an appointment with Dr. Jacqualyn Posey.  "Okay, that's fine."  I transferred her to [employer].

## 2019-01-20 ENCOUNTER — Telehealth: Payer: Self-pay | Admitting: *Deleted

## 2019-01-20 NOTE — Telephone Encounter (Signed)
"  I calling about scheduling an appointment for my surgery with Dr. Jacqualyn Posey."    I informed her that she needs to schedule an appointment with Dr. Jacqualyn Posey for a consult.

## 2019-01-25 ENCOUNTER — Telehealth: Payer: Self-pay | Admitting: *Deleted

## 2019-01-25 NOTE — Telephone Encounter (Signed)
"  I'm calling to schedule my surgery with Dr. Jacqualyn Posey.  He told me to give you a call whenever I was ready to have the surgery."  You need to schedule an appointment with Dr. Jacqualyn Posey for a consultation.  "I thought I already did that and all I needed to do is schedule my surgery."  Did you sign a three page consent form and get a surgical kit?  "No, I have not done that yet."  You need to schedule an appointment with Dr. Jacqualyn Posey.  "Will I have to pay another co-pay for that visit?"  Yes, most likely you will have to pay your co-pay.  Would you like me to transfer you to an appointment scheduler?  "Yes, that would great."  I transferred her to Rema Fendt so she could schedule an appointment.

## 2019-01-26 ENCOUNTER — Ambulatory Visit: Payer: 59 | Admitting: Podiatry

## 2019-01-26 ENCOUNTER — Encounter: Payer: Self-pay | Admitting: Podiatry

## 2019-01-26 VITALS — BP 128/83 | HR 76 | Resp 14

## 2019-01-26 DIAGNOSIS — M2041 Other hammer toe(s) (acquired), right foot: Secondary | ICD-10-CM | POA: Diagnosis not present

## 2019-01-26 DIAGNOSIS — M2011 Hallux valgus (acquired), right foot: Secondary | ICD-10-CM | POA: Diagnosis not present

## 2019-01-26 DIAGNOSIS — M79672 Pain in left foot: Secondary | ICD-10-CM | POA: Diagnosis not present

## 2019-01-26 DIAGNOSIS — M2012 Hallux valgus (acquired), left foot: Secondary | ICD-10-CM

## 2019-01-26 DIAGNOSIS — M2042 Other hammer toe(s) (acquired), left foot: Secondary | ICD-10-CM | POA: Diagnosis not present

## 2019-01-26 NOTE — Patient Instructions (Signed)
Pre-Operative Instructions  Congratulations, you have decided to take an important step towards improving your quality of life.  You can be assured that the doctors and staff at [facility] will be with you every step of the way.  Here are some important things you should know:  1. Plan to be at the surgery center/hospital at least 1 (one) hour prior to your scheduled time, unless otherwise directed by the surgical center/hospital staff.  You must have a responsible adult accompany you, remain during the surgery and drive you home.  Make sure you have directions to the surgical center/hospital to ensure you arrive on time. 2. If you are having surgery at [facility] or [facility] hospitals, you will need a copy of your medical history and physical form from your family physician within one month prior to the date of surgery. We will give you a form for your primary physician to complete.  3. We make every effort to accommodate the date you request for surgery.  However, there are times where surgery dates or times have to be moved.  We will contact you as soon as possible if a change in schedule is required.   4. No aspirin/ibuprofen for one week before surgery.  If you are on aspirin, any non-steroidal anti-inflammatory medications (Mobic, Aleve, Ibuprofen) should not be taken seven (7) days prior to your surgery.  You make take Tylenol for pain prior to surgery.  5. Medications - If you are taking daily heart and blood pressure medications, seizure, reflux, allergy, asthma, anxiety, pain or diabetes medications, make sure you notify the surgery center/hospital before the day of surgery so they can tell you which medications you should take or avoid the day of surgery. 6. No food or drink after midnight the night before surgery unless directed otherwise by surgical center/hospital staff. 7. No alcoholic beverages 24-hours prior to surgery.  No smoking 24-hours prior or 24-hours after  surgery. 8. Wear loose pants or shorts. They should be loose enough to fit over bandages, boots, and casts. 9. Don't wear slip-on shoes. Sneakers are preferred. 10. Bring your boot with you to the surgery center/hospital.  Also bring crutches or a walker if your physician has prescribed it for you.  If you do not have this equipment, it will be provided for you after surgery. 11. If you have not been contacted by the surgery center/hospital by the day before your surgery, call to confirm the date and time of your surgery. 12. Leave-time from work may vary depending on the type of surgery you have.  Appropriate arrangements should be made prior to surgery with your employer. 13. Prescriptions will be provided immediately following surgery by your doctor.  Fill these as soon as possible after surgery and take the medication as directed. Pain medications will not be refilled on weekends and must be approved by the doctor. 14. Remove nail polish on the operative foot and avoid getting pedicures prior to surgery. 15. Wash the night before surgery.  The night before surgery wash the foot and leg well with water and the antibacterial soap provided. Be sure to pay special attention to beneath the toenails and in between the toes.  Wash for at least three (3) minutes. Rinse thoroughly with water and dry well with a towel.  Perform this wash unless told not to do so by your physician.  Enclosed: 1 Ice pack (please put in freezer the night before surgery)   1 Hibiclens skin cleaner     Pre-op instructions  If you have any questions regarding the instructions, please do not hesitate to call our office.  [place]: [address], [place], [place] [address] -- [phone]  [place]: [address] [place], [place] [address] -- [phone]  [place]: [address]  [place], [place] [address] -- [phone]  [place]: [address], [address], [place],  [address] -- [phone]  Website:  [email or web address] 

## 2019-01-26 NOTE — Progress Notes (Signed)
Subjective: 38 year old female presents the office today for surgical consultation.  She states that both wounds are still hurting she was to go to proceed with surgery in the left side.  She also states that fifth toe is been hurting still despite offloading and padding.  Regards the bunions she has tried shoe modifications, offloading padding without any significant improvement. Denies any systemic complaints such as fevers, chills, nausea, vomiting. No acute changes since last appointment, and no other complaints at this time.   Objective: AAO x3, NAD DP/PT pulses palpable bilaterally, CRT less than 3 seconds Severe bunion deformities present bilaterally left side worse than right there is tenderness on the bunion area.  Also adductovarus is present left fifth toe causing discomfort on the PIPJ.  There is mild erythema to these areas where from inside shoes.  There is hypermobility present in the first ray.  There is no pain crepitation with MPJ range of motion. No open lesions or pre-ulcerative lesions.  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  -I again reviewed the x-rays with her and we discussed multiple options both conservative as well as surgical.  At this time she was to proceed with surgery -We will plan for a Lapidus bunionectomy left foot (likely lapiplasty) arthroplasty of the fifth digit. -The incision placement as well as the postoperative course was discussed with the patient. I discussed risks of the surgery which include, but not limited to, infection, bleeding, pain, swelling, need for further surgery, delayed or nonhealing, painful or ugly scar, numbness or sensation changes, over/under correction, recurrence, transfer lesions, further deformity, hardware failure, DVT/PE, loss of toe/foot.  Patient understands these risks and wishes to proceed with surgery. The surgical consent was reviewed with the patient all 3 pages were signed. No promises or guarantees were given to the outcome of the procedure. All questions were answered to the best of my ability. Before the surgery the patient was encouraged to call the office if there is any further questions. The surgery will be performed at the [facility] on an outpatient basis. -CAM boot dispensed for postop use   Trula Slade DPM  -Patient encouraged to call the office with any questions, concerns, change in symptoms.

## 2019-02-15 ENCOUNTER — Telehealth: Payer: Self-pay | Admitting: *Deleted

## 2019-02-15 NOTE — Telephone Encounter (Signed)
I left the patient a message to call me to reschedule her appointment that's scheduled for 02/22/2019.

## 2019-02-15 NOTE — Telephone Encounter (Signed)
"  I'm returning your call."  Yes, I was calling to reschedule your surgery that is scheduled for 02/23/2019.  Can you do it on March 08, 2019?  "Yes, that date will be fine.  Will I need to contact my HR and disability company about that?"  Yes, you will.

## 2019-02-22 ENCOUNTER — Telehealth: Payer: Self-pay | Admitting: *Deleted

## 2019-02-22 NOTE — Telephone Encounter (Signed)
I left Caitlin Ingram a message to call me back.  I informed her that we need to reschedule her appointment that's scheduled for March 08, 2019.  I offered her the date of 04/12/2019.

## 2019-02-22 NOTE — Telephone Encounter (Signed)
"  I just received your message to call you."  Yes, I need to reschedule your surgery from April 15.  "I was just getting ready to call [employer].  I'm glad you called.  When can he do it?"  He can do it on 04/12/2019.  "Okay, I'll write that down."  Be safe.  I rescheduled her surgery from 03/08/2019 to 04/12/19 via the surgical center's [facility] [employer] Portal.

## 2019-03-27 ENCOUNTER — Telehealth: Payer: Self-pay | Admitting: *Deleted

## 2019-03-27 NOTE — Telephone Encounter (Signed)
"  I was calling you because I had received a word from my employer that they had changed the date for my operation once again and I had never received notification about it.  So, I'm not even sure what the date is.  I have not been downstairs to HR yet.  But she let me know that she had received a fax concerning my changed date for my operation.  I want to also know why they are pushing the date back..  I'll try to contact you again.  I get off work at 3:30 pm.  Hopefull you will be there.  Talk to you soon."  "Did you get my message?  Yes, I just took the message out of my voicemail.  We still have you scheduled for Apr 12, 2019.  Maybe the date was wrong on your FMLA paperwork.  "Okay, I'll call HR now and tell them I'm still scheduled for Apr 12, 2019."  I'll ask Marcie Bal to make sure she put Apr 12, 2019 on your FMLA paperwork.  "I okay, thank you."

## 2019-03-31 ENCOUNTER — Telehealth: Payer: Self-pay | Admitting: *Deleted

## 2019-03-31 NOTE — Telephone Encounter (Signed)
DOS 04/12/2019, CPT CODES - 87215, 87276 - LAPIDUS WITH A BUNIONECTOMY AND HAMMER TOE REPAIR 5TH LEFT FOOT  [employer]: Active Coverage: 12/24/2018 - 11/23/2019  Individual In-Network (Calendar Year) Deductible $212.11 MET YTD  $1,848.59 remaining  $3,000.00 Plan Amt.   Out-of-Pocket $312.11 MET YTD  $6,037.89 remaining  $6,350.00 Plan Amt.  Physician Fees for Surgical and Medical Services 70% of eligible expenses after satisfying the deductible.  ------------------------------------------------------------------------------------------------------------------------------------------------------------ [place]: [id number] PENDING AUTH.

## 2019-04-07 NOTE — Telephone Encounter (Signed)
SURGERY WAS APPROVED SEE PENDING NUMBER BELOW.  [facility]  Coverage determination is reflected for the facility admission and is not a guarantee of payment for ongoing services. Covered/Approved 04/06/2019  [id number] Correction, hammertoe (eg, interphalange Covered/Approved 04/06/2019 2. [id number] Correction, hallux valgus (bunionectomy)  Covered/Approved 04/06/2019

## 2019-04-12 ENCOUNTER — Other Ambulatory Visit: Payer: Self-pay | Admitting: Podiatry

## 2019-04-12 ENCOUNTER — Encounter: Payer: Self-pay | Admitting: Podiatry

## 2019-04-12 DIAGNOSIS — M2042 Other hammer toe(s) (acquired), left foot: Secondary | ICD-10-CM | POA: Diagnosis not present

## 2019-04-12 DIAGNOSIS — M2012 Hallux valgus (acquired), left foot: Secondary | ICD-10-CM | POA: Diagnosis not present

## 2019-04-12 MED ORDER — OXYCODONE-ACETAMINOPHEN 5-325 MG PO TABS: 1.0000 | ORAL_TABLET | Freq: Four times a day (QID) | ORAL | 0 refills | Status: DC | PRN

## 2019-04-12 MED ORDER — CLINDAMYCIN HCL 300 MG PO CAPS: 300.0000 mg | ORAL_CAPSULE | Freq: Three times a day (TID) | ORAL | 0 refills | Status: DC

## 2019-04-12 MED ORDER — PROMETHAZINE HCL 25 MG PO TABS: 25.0000 mg | ORAL_TABLET | Freq: Three times a day (TID) | ORAL | 0 refills | Status: DC | PRN

## 2019-04-12 NOTE — Progress Notes (Signed)
Postop medications sent 

## 2019-04-13 ENCOUNTER — Telehealth: Payer: Self-pay | Admitting: *Deleted

## 2019-04-13 NOTE — Telephone Encounter (Signed)
Called and left a message for the patient to call me-I was calling to check on patient to see how patient was doing after surgery with Dr Jacqualyn Posey on Wednesday and left our [place] office number [phone]. Lattie Haw

## 2019-04-14 ENCOUNTER — Telehealth: Payer: Self-pay | Admitting: *Deleted

## 2019-04-14 NOTE — Telephone Encounter (Signed)
Called and spoke with the patient and the patient is doing better and I am taken my ibuprofen and the antibiotics and the block is wearing off and there is no fever or chills and no nausea and the next time I take the pain medicine is at 3 pm today and I am icing and elevating and to call the [place] office if any concerns and questions at [phone]. Caitlin Ingram

## 2019-04-17 ENCOUNTER — Telehealth: Payer: Self-pay | Admitting: Podiatry

## 2019-04-17 NOTE — Telephone Encounter (Signed)
Patient presented to the office for an appointment on [place] day and found the office closed.  She says she is doing well following her surgery.  She was told to call the office Tuesday  Morning and reschedule her appointment.  Gardiner Barefoot DPM

## 2019-04-18 NOTE — Progress Notes (Signed)
This encounter was created in error - please disregard.

## 2019-04-20 ENCOUNTER — Other Ambulatory Visit: Payer: Self-pay

## 2019-04-20 ENCOUNTER — Ambulatory Visit (INDEPENDENT_AMBULATORY_CARE_PROVIDER_SITE_OTHER): Payer: 59

## 2019-04-20 ENCOUNTER — Ambulatory Visit (HOSPITAL_COMMUNITY)
Admission: EM | Admit: 2019-04-20 | Discharge: 2019-04-20 | Disposition: A | Discharge status: Home / Self Care | Payer: 59 | Attending: Internal Medicine | Admitting: Internal Medicine

## 2019-04-20 ENCOUNTER — Encounter (HOSPITAL_COMMUNITY): Payer: Self-pay | Admitting: Family Medicine

## 2019-04-20 DIAGNOSIS — K5900 Constipation, unspecified: Secondary | ICD-10-CM

## 2019-04-20 DIAGNOSIS — K5909 Other constipation: Secondary | ICD-10-CM | POA: Diagnosis not present

## 2019-04-20 DIAGNOSIS — R109 Unspecified abdominal pain: Secondary | ICD-10-CM

## 2019-04-20 MED ORDER — MAGNESIUM CITRATE PO SOLN: 1.0000 | Freq: Once | ORAL | 1 refills | Status: AC

## 2019-04-20 NOTE — Discharge Instructions (Signed)
Nothing concerning today on abdominal x-ray. We will try the magnesium citrate to see if this helps with your symptoms As we spoke about we always worry about possible gallbladder issues with the area of pain that you are having in the referred shoulder pain. If your symptoms do not resolve with a good bowel movement or continue you will need to follow-up for ultrasound and further evaluation.

## 2019-04-20 NOTE — ED Provider Notes (Signed)
[place]    CSN: [id number] Arrival date & time: 04/20/19  2423     History   Chief Complaint Chief Complaint  Patient presents with  . Constipation    HPI Caitlin Ingram is a 38 y.o. female.   Pt is a 38 year old female that presents with constipation. This has been a problem x 1 month. Reports some diet changes and foot surgery in march. She was also taking oxycodone at that time. She is passing some stool but small amount. No rectal bleeding. She has had gas an bloating, more discomfort in the right abdominal area.  Denies any associated fever, chills, body aches, nausea, vomiting.  She has been using Epson salt laxatives without any relief.  ROS per HPI      Past Medical History:  Diagnosis Date  . Renal disorder    renal calculi    Patient Active Problem List   Diagnosis Date Noted  . Valgus deformity of both great toes 01/11/2019  . Pain in right knee 03/03/2018    Past Surgical History:  Procedure Laterality Date  . CYST REMOVAL NECK  1990    OB History   No obstetric history on file.      Home Medications    Prior to Admission medications   Medication Sig Start Date End Date Taking? Authorizing Provider  clindamycin (CLEOCIN) 300 MG capsule Take 1 capsule (300 mg total) by mouth 3 (three) times daily. 04/12/19   Trula Slade, DPM  magnesium citrate SOLN Take 296 mLs (1 Bottle total) by mouth once for 1 dose. 04/20/19 04/20/19  Orvan July, NP  promethazine (PHENERGAN) 25 MG tablet Take 1 tablet (25 mg total) by mouth every 8 (eight) hours as needed for nausea or vomiting. 04/12/19   Trula Slade, DPM    Family History No family history on file.  Social History Social History   Tobacco Use  . Smoking status: Current Every Day Smoker    Types: Cigars  . Smokeless tobacco: Never Used  Substance Use Topics  . Alcohol use: No  . Drug use: No     Allergies   Amoxicillin   Review of Systems Review of Systems    Physical Exam Triage Vital Signs ED Triage Vitals  Enc Vitals Group     BP 04/20/19 1010 138/81     Pulse Rate 04/20/19 1010 75     Resp 04/20/19 1010 16     Temp 04/20/19 1010 98.6 F (37 C)     Temp Source 04/20/19 1010 Oral     SpO2 04/20/19 1010 100 %     Weight 04/20/19 1037 196 lb (88.9 kg)     Height --      Head Circumference --      Peak Flow --      Pain Score 04/20/19 1036 6     Pain Loc --      Pain Edu? --      Excl. in [place]? --    No data found.  Updated Vital Signs BP 138/81 (BP Location: Left Arm)   Pulse 75   Temp 98.6 F (37 C) (Oral)   Resp 16   Wt 196 lb (88.9 kg)   LMP 04/08/2019 (Exact Date)   SpO2 100%   BMI 31.64 kg/m   Visual Acuity Right Eye Distance:   Left Eye Distance:   Bilateral Distance:    Right Eye Near:   Left Eye Near:  Bilateral Near:     Physical Exam Vitals signs and nursing note reviewed.  Constitutional:      General: She is not in acute distress.    Appearance: Normal appearance. She is not ill-appearing, toxic-appearing or diaphoretic.  HENT:     Head: Normocephalic and atraumatic.     Nose: Nose normal.     Mouth/Throat:     Pharynx: Oropharynx is clear.  Eyes:     Conjunctiva/sclera: Conjunctivae normal.  Neck:     Musculoskeletal: Normal range of motion.  Pulmonary:     Effort: Pulmonary effort is normal.  Abdominal:     General: There is no distension.     Palpations: Abdomen is soft.     Tenderness: There is no abdominal tenderness.  Musculoskeletal: Normal range of motion.  Skin:    General: Skin is warm and dry.     Findings: No rash.  Neurological:     Mental Status: She is alert.  Psychiatric:        Mood and Affect: Mood normal.      UC Treatments / Results  Labs (all labs ordered are listed, but only abnormal results are displayed) Labs Reviewed - No data to display  EKG None  Radiology Dg Abd 1 View  Result Date: 04/20/2019 CLINICAL DATA:  Constipation. EXAM: ABDOMEN - 1  VIEW COMPARISON:  CT abdomen pelvis dated November 12, 2013. FINDINGS: The bowel gas pattern is normal. Normal stool burden. No radio-opaque calculi or other significant radiographic abnormality are seen. No acute osseous abnormality. IMPRESSION: Negative. Electronically Signed   By: Titus Dubin M.D.   On: 04/20/2019 11:59    Procedures Procedures (including critical care time)  Medications Ordered in UC Medications - No data to display  Initial Impression / Assessment and Plan / UC Course  I have reviewed the triage vital signs and the nursing notes.  Pertinent labs & imaging results that were available during my care of the patient were reviewed by me and considered in my medical decision making (see chart for details).     X ray with normal stool burden and gas Nothing concerning.no obstruction.  Instructed that she could try mag citrate to see if this helps in case there is some small backup of stool Due to the right upper quadrant discomfort that comes and goes and the referred right shoulder pain I recommended that her symptoms continue despite having good bowel movements she will need to have further evaluation with ultrasound. No acute abdomen today.  No concerning signs or symptoms.  She is nontoxic or ill-appearing.  And her vital signs are stable. Patient understand and agree.  Final Clinical Impressions(s) / UC Diagnoses   Final diagnoses:  None     Discharge Instructions     Nothing concerning today on abdominal x-ray. We will try the magnesium citrate to see if this helps with your symptoms As we spoke about we always worry about possible gallbladder issues with the area of pain that you are having in the referred shoulder pain. If your symptoms do not resolve with a good bowel movement or continue you will need to follow-up for ultrasound and further evaluation.    ED Prescriptions    Medication Sig Dispense Auth. Provider   magnesium citrate SOLN Take 296  mLs (1 Bottle total) by mouth once for 1 dose. 195 mL Loura Halt A, NP     Controlled Substance Prescriptions [place] Controlled Substance Registry consulted? Not Applicable   Loura Halt  A, NP 04/20/19 1631

## 2019-04-20 NOTE — ED Triage Notes (Signed)
Pt cc states she has been constipated for a while over a week. Pt states she has been using Epson salt.

## 2019-04-21 ENCOUNTER — Ambulatory Visit (INDEPENDENT_AMBULATORY_CARE_PROVIDER_SITE_OTHER): Payer: 59 | Admitting: Podiatry

## 2019-04-21 ENCOUNTER — Ambulatory Visit (INDEPENDENT_AMBULATORY_CARE_PROVIDER_SITE_OTHER): Payer: 59

## 2019-04-21 ENCOUNTER — Encounter: Payer: Self-pay | Admitting: Podiatry

## 2019-04-21 VITALS — Temp 97.9°F

## 2019-04-21 DIAGNOSIS — M2012 Hallux valgus (acquired), left foot: Secondary | ICD-10-CM

## 2019-04-21 DIAGNOSIS — M2042 Other hammer toe(s) (acquired), left foot: Secondary | ICD-10-CM

## 2019-04-21 DIAGNOSIS — M2041 Other hammer toe(s) (acquired), right foot: Secondary | ICD-10-CM | POA: Diagnosis not present

## 2019-04-21 DIAGNOSIS — Z09 Encounter for follow-up examination after completed treatment for conditions other than malignant neoplasm: Secondary | ICD-10-CM

## 2019-04-21 DIAGNOSIS — M2011 Hallux valgus (acquired), right foot: Secondary | ICD-10-CM | POA: Diagnosis not present

## 2019-04-22 NOTE — Progress Notes (Signed)
Subjective: Caitlin Ingram is a 38 y.o. is seen today in office s/p left foot lapidusbunionectomy and fifth digit PIPJ arthroplasty preformed on 04/12/2019.  Overall she is been doing well and she is not taking pain medication.  She has been nonweightbearing.  Her appointment was scheduled for Tuesday but unfortunately she thought it was on Monday when the office is closed for the holiday and she cannot get a ride until today. Denies any systemic complaints such as fevers, chills, nausea, vomiting. No calf pain, chest pain, shortness of breath.   Objective: General: No acute distress, AAOx3  DP/PT pulses palpable 2/4, CRT < 3 sec to all digits.  Protective sensation intact. Motor function intact.  LEFT foot: Incision is well coapted without any evidence of dehiscence and sutures are intact. There is no surrounding erythema, ascending cellulitis, fluctuance, crepitus, malodor, drainage/purulence. There is moderate edema around the surgical site. There is minimal discomfort along the surgical site.  No other areas of tenderness to bilateral lower extremities.  No other open lesions or pre-ulcerative lesions.  No pain with calf compression, swelling, warmth, erythema.   Assessment and Plan:  Status post left foot surgery, doing well with no complications   -Treatment options discussed including all alternatives, risks, and complications -X-rays reviewed.  Hardware intact.  Difficult to evaluate the position first metatarsal given the oblique views.  No evidence of complicating factors however. -Antibiotic ointment and a bandage was applied to the incision followed by dry sterile dressing.  Keep the dressings clean, dry, intact.  Compression bandages applied due to the swelling. -Ice/elevation -Pain medication as needed. -Finish course of antibiotics.  Anti-inflammatories as needed. -Monitor for any clinical signs or symptoms of infection and DVT/PE and directed to call the office immediately  should any occur or go to the ER. -Follow-up as scheduled for possible suture removal or sooner if any problems arise. In the meantime, encouraged to call the office with any questions, concerns, change in symptoms.   Celesta Gentile, DPM

## 2019-04-25 ENCOUNTER — Encounter: Payer: 59 | Admitting: Podiatry

## 2019-04-28 ENCOUNTER — Ambulatory Visit (INDEPENDENT_AMBULATORY_CARE_PROVIDER_SITE_OTHER): Payer: 59 | Admitting: Podiatry

## 2019-04-28 ENCOUNTER — Other Ambulatory Visit: Payer: Self-pay

## 2019-04-28 ENCOUNTER — Encounter: Payer: Self-pay | Admitting: Podiatry

## 2019-04-28 ENCOUNTER — Ambulatory Visit (INDEPENDENT_AMBULATORY_CARE_PROVIDER_SITE_OTHER): Payer: 59

## 2019-04-28 VITALS — Temp 98.1°F

## 2019-04-28 DIAGNOSIS — M2012 Hallux valgus (acquired), left foot: Secondary | ICD-10-CM

## 2019-04-28 DIAGNOSIS — M21619 Bunion of unspecified foot: Secondary | ICD-10-CM

## 2019-04-28 DIAGNOSIS — M2042 Other hammer toe(s) (acquired), left foot: Secondary | ICD-10-CM

## 2019-04-28 DIAGNOSIS — M2041 Other hammer toe(s) (acquired), right foot: Secondary | ICD-10-CM

## 2019-04-28 NOTE — Progress Notes (Signed)
Subjective: Caitlin Ingram is a 38 y.o. is seen today in office s/p left foot lapidusbunionectomy and fifth digit PIPJ arthroplasty preformed on 04/12/2019.  Overall she states that she is feeling well and is only taking ibuprofen as needed for discomfort and did not take any narcotic pain medication.  She does state that she has put her foot on the ground in the cam boot only for short amount of time.  She states that she has not put too much pressure on the foot because she has been hesitant to do so and also rubs the fifth toe at times.  Otherwise she states that she has been doing well.  She presents today for possible suture removal. Denies any systemic complaints such as fevers, chills, nausea, vomiting. No calf pain, chest pain, shortness of breath.   Objective: General: No acute distress, AAOx3  DP/PT pulses palpable 2/4, CRT < 3 sec to all digits.  Protective sensation intact. Motor function intact.  LEFT foot: Incision is well coapted without any evidence of dehiscence and sutures are intact.  There is decreased edema to the surgical sites in the foot.  There is no surrounding erythema, ascending cellulitis to the surgical sites.  No pain the second metatarsal.  There is no fluctuation crepitation or any malodor.  No clinical signs of infection are noted today.  Toes are in rectus position.  No significant discomfort of the surgical sites. No other open lesions or pre-ulcerative lesions.  No pain with calf compression, swelling, warmth, erythema.   Assessment and Plan:  Status post left foot surgery, doing well with no complications   -Treatment options discussed including all alternatives, risks, and complications -X-rays reviewed.  Hardware intact.  Status post Lapidus bunionectomy as well as fifth digit arthroplasty.  Some mild bruising seen second tarsal diaphysis concerning for possible stress reaction but no definite evidence of acute fracture identified otherwise.  -I remove the  sutures today from the fifth toe today and dorsal foot without any complications or bleeding.  After removal incision remained well coapted.  Steri-Strips were applied for reinforcement. -As I do want her to remain in the cam boot at all times.  She can slowly start to transition to be weightbearing in the cam boot but I want her use crutches to take some pressure off the foot.  Over the next week to 2 weeks she can start to transition to walking in the cam boot as she is able to but there is any increase in pain she is to remain nonweightbearing and call the office. -Continue to ice elevate. -Pain medication as needed.  She is only taking anti-inflammatories. -Must wear cam boot at all times.  Although she can start to slowly transition to weightbearing I do want her to limit the amount.  Celesta Gentile, DPM

## 2019-05-12 ENCOUNTER — Encounter: Payer: Self-pay | Admitting: Podiatry

## 2019-05-12 ENCOUNTER — Other Ambulatory Visit: Payer: Self-pay

## 2019-05-12 ENCOUNTER — Ambulatory Visit (INDEPENDENT_AMBULATORY_CARE_PROVIDER_SITE_OTHER): Payer: 59

## 2019-05-12 ENCOUNTER — Ambulatory Visit (INDEPENDENT_AMBULATORY_CARE_PROVIDER_SITE_OTHER): Payer: 59 | Admitting: Podiatry

## 2019-05-12 VITALS — Temp 98.6°F

## 2019-05-12 DIAGNOSIS — M2012 Hallux valgus (acquired), left foot: Secondary | ICD-10-CM

## 2019-05-12 DIAGNOSIS — M21619 Bunion of unspecified foot: Secondary | ICD-10-CM

## 2019-05-15 ENCOUNTER — Other Ambulatory Visit: Payer: Self-pay | Admitting: Podiatry

## 2019-05-15 DIAGNOSIS — M2012 Hallux valgus (acquired), left foot: Secondary | ICD-10-CM

## 2019-05-21 NOTE — Progress Notes (Signed)
Subjective: Caitlin Ingram is a 38 y.o. is seen today in office s/p left foot lapidusbunionectomy and fifth digit PIPJ arthroplasty preformed on 04/12/2019.  She has been walking a cam boot.  Overall she has been doing well.  The sutures are causing some itching but otherwise no concerns. Denies any systemic complaints such as fevers, chills, nausea, vomiting. No calf pain, chest pain, shortness of breath.   Objective: General: No acute distress, AAOx3  DP/PT pulses palpable 2/4, CRT < 3 sec to all digits.  Protective sensation intact. Motor function intact.  LEFT foot: Incision is well coapted without any evidence of dehiscence and sutures are intact.  There is rest position.  There is minimal discomfort of the surgical site.  No pain is in the metatarsal.  There is no other areas of tenderness elicited this time but there is no erythema or warmth there is no clinical signs of infection noted today. No other open lesions or pre-ulcerative lesions.  No pain with calf compression, swelling, warmth, erythema.   Assessment and Plan:  Status post left foot surgery, doing well with no complications   -Treatment options discussed including all alternatives, risks, and complications -X-rays reviewed.  Hardware intact.  Status post Lapidus bunionectomy as well as fifth digit arthroplasty.  No evidence of acute fracture.  Some lucency still present the second metatarsal diaphysis but no overt fracture. -Sutures removed today.  Antibiotic ointment and a bandage applied. -Continue cam boot and weightbearing to limit activity.  Ice elevation. -Pain medication as needed.  Return in about 2 weeks (around 05/26/2019).  Repeat x-rays next appointment  Trula Slade DPM

## 2019-05-22 ENCOUNTER — Emergency Department (HOSPITAL_COMMUNITY)
Admission: EM | Admit: 2019-05-22 | Discharge: 2019-05-22 | Disposition: A | Discharge status: Home / Self Care | Payer: 59 | Attending: Emergency Medicine | Admitting: Emergency Medicine

## 2019-05-22 ENCOUNTER — Encounter (HOSPITAL_COMMUNITY): Payer: Self-pay

## 2019-05-22 ENCOUNTER — Other Ambulatory Visit: Payer: Self-pay

## 2019-05-22 ENCOUNTER — Emergency Department (HOSPITAL_COMMUNITY): Payer: 59

## 2019-05-22 DIAGNOSIS — R1011 Right upper quadrant pain: Secondary | ICD-10-CM | POA: Insufficient documentation

## 2019-05-22 DIAGNOSIS — K5900 Constipation, unspecified: Secondary | ICD-10-CM | POA: Diagnosis not present

## 2019-05-22 DIAGNOSIS — R1031 Right lower quadrant pain: Secondary | ICD-10-CM | POA: Diagnosis not present

## 2019-05-22 DIAGNOSIS — F17290 Nicotine dependence, other tobacco product, uncomplicated: Secondary | ICD-10-CM | POA: Insufficient documentation

## 2019-05-22 DIAGNOSIS — R109 Unspecified abdominal pain: Secondary | ICD-10-CM

## 2019-05-22 LAB — CBC
HCT: 44.9 % (ref 36.0–46.0)
Hemoglobin: 14.7 g/dL (ref 12.0–15.0)
MCH: 29.1 pg (ref 26.0–34.0)
MCHC: 32.7 g/dL (ref 30.0–36.0)
MCV: 88.7 fL (ref 80.0–100.0)
Platelets: 245 10*3/uL (ref 150–400)
RBC: 5.06 MIL/uL (ref 3.87–5.11)
RDW: 14.6 % (ref 11.5–15.5)
WBC: 9.4 10*3/uL (ref 4.0–10.5)
nRBC: 0 % (ref 0.0–0.2)

## 2019-05-22 LAB — URINALYSIS, ROUTINE W REFLEX MICROSCOPIC
Bilirubin Urine: NEGATIVE
Glucose, UA: NEGATIVE mg/dL
Hgb urine dipstick: NEGATIVE
Ketones, ur: 80 mg/dL — AB
Leukocytes,Ua: NEGATIVE
Nitrite: NEGATIVE
Protein, ur: 100 mg/dL — AB
Specific Gravity, Urine: 1.03 (ref 1.005–1.030)
pH: 5 (ref 5.0–8.0)

## 2019-05-22 LAB — COMPREHENSIVE METABOLIC PANEL
ALT: 15 U/L (ref 0–44)
AST: 22 U/L (ref 15–41)
Albumin: 3.9 g/dL (ref 3.5–5.0)
Alkaline Phosphatase: 72 U/L (ref 38–126)
Anion gap: 8 (ref 5–15)
BUN: 11 mg/dL (ref 6–20)
CO2: 26 mmol/L (ref 22–32)
Calcium: 9.5 mg/dL (ref 8.9–10.3)
Chloride: 104 mmol/L (ref 98–111)
Creatinine, Ser: 0.86 mg/dL (ref 0.44–1.00)
GFR calc Af Amer: >60 mL/min (ref >60)
GFR calc non Af Amer: >60 mL/min (ref >60)
Glucose, Bld: 80 mg/dL (ref 70–99)
Potassium: 4 mmol/L (ref 3.5–5.1)
Sodium: 138 mmol/L (ref 135–145)
Total Bilirubin: 1.2 mg/dL (ref 0.3–1.2)
Total Protein: 6.6 g/dL (ref 6.5–8.1)

## 2019-05-22 LAB — I-STAT BETA HCG BLOOD, ED (MC, WL, AP ONLY): I-stat hCG, quantitative: <5 m[IU]/mL (ref <5)

## 2019-05-22 LAB — LIPASE, BLOOD: Lipase: 18 U/L (ref 11–51)

## 2019-05-22 MED ORDER — DOCUSATE SODIUM 100 MG PO CAPS
200.0000 mg | ORAL_CAPSULE | Freq: Once | ORAL | Status: AC
  Administered 2019-05-22: 200 mg via ORAL
  Filled 2019-05-22: qty 2

## 2019-05-22 MED ORDER — SODIUM CHLORIDE 0.9% FLUSH: 3.0000 mL | Freq: Once | INTRAVENOUS | Status: DC

## 2019-05-22 MED ORDER — POLYETHYLENE GLYCOL 3350 17 G PO PACK
34.0000 g | PACK | Freq: Once | ORAL | Status: AC
  Administered 2019-05-22: 21:00:00 34 g via ORAL
  Filled 2019-05-22: qty 2

## 2019-05-22 MED ORDER — POLYETHYLENE GLYCOL 3350 17 G PO PACK: 17.0000 g | PACK | Freq: Four times a day (QID) | ORAL | 0 refills | Status: AC

## 2019-05-22 MED ORDER — FLEET ENEMA 7-19 GM/118ML RE ENEM: 1.0000 | ENEMA | Freq: Every day | RECTAL | 0 refills | Status: DC | PRN

## 2019-05-22 MED ORDER — SENNOSIDES-DOCUSATE SODIUM 8.6-50 MG PO TABS: 2.0000 | ORAL_TABLET | Freq: Two times a day (BID) | ORAL | 0 refills | Status: DC

## 2019-05-22 NOTE — ED Provider Notes (Signed)
[facility] Provider Note   CSN: [id number] Arrival date & time: 05/22/19  1533     History   Chief Complaint Chief Complaint  Patient presents with  . GI Problem    HPI Caitlin Ingram is a 38 y.o. female.     The history is provided by the patient and medical records.  Abdominal Pain Pain location:  R flank and RUQ Pain quality: aching, burning, cramping and dull   Pain radiates to:  Does not radiate Pain severity:  Moderate Onset quality:  Gradual Duration:  2 weeks Timing:  Intermittent Progression:  Unchanged Chronicity:  New Context: awakening from sleep and eating   Context: not recent illness, not recent travel, not retching, not sick contacts, not suspicious food intake and not trauma   Relieved by:  Nothing Worsened by:  Eating and palpation Ineffective treatments:  Urination, position changes, movement, lying down, eating, liquids, heat, cold, flatus and not moving Associated symptoms: constipation and fatigue   Associated symptoms: no chest pain, no chills, no cough, no diarrhea, no dysuria, no fever, no hematemesis, no hematochezia, no hematuria, no melena, no nausea, no shortness of breath, no vaginal bleeding, no vaginal discharge and no vomiting   Risk factors: obesity   Risk factors: not elderly and not pregnant     Past Medical History:  Diagnosis Date  . Renal disorder    renal calculi    Patient Active Problem List   Diagnosis Date Noted  . Valgus deformity of both great toes 01/11/2019  . Pain in right knee 03/03/2018    Past Surgical History:  Procedure Laterality Date  . CYST REMOVAL NECK  1990     OB History   No obstetric history on file.      Home Medications    Prior to Admission medications   Medication Sig Start Date End Date Taking? Authorizing Provider  clindamycin (CLEOCIN) 300 MG capsule Take 1 capsule (300 mg total) by mouth 3 (three) times daily. 04/12/19   Trula Slade, DPM  [facility] CITRATE OF MAGNESIA SOLN DRINK 1 BOTTLE BY MOUTH ONCE AS 1 DOSE 04/20/19   [provider]  promethazine (PHENERGAN) 25 MG tablet Take 1 tablet (25 mg total) by mouth every 8 (eight) hours as needed for nausea or vomiting. 04/12/19   Trula Slade, DPM    Family History History reviewed. No pertinent family history.  Social History Social History   Tobacco Use  . Smoking status: Current Every Day Smoker    Types: Cigars  . Smokeless tobacco: Never Used  Substance Use Topics  . Alcohol use: Yes    Comment: occassionally  . Drug use: Yes    Types: Marijuana     Allergies   Amoxicillin and Penicillins   Review of Systems Review of Systems  Constitutional: Positive for fatigue. Negative for chills and fever.  Respiratory: Negative for cough and shortness of breath.   Cardiovascular: Negative for chest pain.  Gastrointestinal: Positive for abdominal pain and constipation. Negative for diarrhea, hematemesis, hematochezia, melena, nausea and vomiting.  Genitourinary: Negative for dysuria, hematuria, vaginal bleeding and vaginal discharge.  All other systems reviewed and are negative.    Physical Exam Updated Vital Signs BP (!) 180/85 (BP Location: Right Arm)   Pulse 68   Temp 98.5 F (36.9 C) (Oral)   Resp 18   LMP 04/19/2019   SpO2 100%   Physical Exam Vitals signs and nursing note reviewed.  Constitutional:  Appearance: She is well-developed. She is not toxic-appearing.  HENT:     Head: Normocephalic and atraumatic.     Mouth/Throat:     Mouth: Mucous membranes are moist.  Eyes:     Conjunctiva/sclera: Conjunctivae normal.  Neck:     Musculoskeletal: Neck supple. No neck rigidity or muscular tenderness.  Cardiovascular:     Rate and Rhythm: Normal rate.  Pulmonary:     Effort: Pulmonary effort is normal. No respiratory distress.     Breath sounds: No stridor. No wheezing or rhonchi.  Abdominal:     Palpations: Abdomen is soft.      Tenderness: There is abdominal tenderness. There is no guarding or rebound.  Musculoskeletal:     Right lower leg: No edema.     Left lower leg: No edema.  Skin:    General: Skin is warm and dry.     Capillary Refill: Capillary refill takes less than 2 seconds.     Findings: No rash.  Neurological:     Mental Status: She is alert and oriented to person, place, and time.  Psychiatric:        Behavior: Behavior normal.      ED Treatments / Results  Labs (all labs ordered are listed, but only abnormal results are displayed) Labs Reviewed  LIPASE, BLOOD  COMPREHENSIVE METABOLIC PANEL  CBC  URINALYSIS, ROUTINE W REFLEX MICROSCOPIC  I-STAT BETA HCG BLOOD, ED ([facility], [facility], [facility] ONLY)    EKG    Radiology No results found.  Procedures Procedures (including critical care time)  Medications Ordered in ED Medications  sodium chloride flush (NS) 0.9 % injection 3 mL (has no administration in time range)     Initial Impression / Assessment and Plan / ED Course  I have reviewed the triage vital signs and the nursing notes.  Pertinent labs & imaging results that were available during my care of the patient were reviewed by me and considered in my medical decision making (see chart for details).        Medical Decision Making: Caitlin Ingram is a 38 y.o. female who presented to the ED today with abdominal pain, constipation.  Past medical history significant for knee pain, nephrolithiasis Reviewed and confirmed nursing documentation for past medical history, family history, social history.  On my initial exam, the pt was calm, cooperative, conversant, follows commands appropriately, GCS 15, not tachycardic, not hypotensive, afebrile, no increased work of breathing or respiratory distress, no signs of impending respiratory failure.   Emergent causes of abdominal pain considered include appendicitis however negative McBurney's sign, gallbladder etiology however negative Murphy  sign, will assess hepatic function and bilirubin, pancreatic etiology, will assess lipase, mesenteric adenitis versus ischemic considered however pain not out of proportion to exam, no overt signs of peritonitis, pyelonephritis versus nephrolithiasis considered, will check urine studies, patient is female, negative pregnancy test, ovarian torsion versus ectopic pregnancy considered, pelvic inflammatory disease considered patient denies any unsafe sexual practices, denies vaginal discharge or any vaginal symptoms or bleeding.  Other causes of abdominal pain considered could be constipation, gastritis, irritable bowel disease, peptic ulcer disease Decreasing stool frequency, has tried laxatives x1.  Lipase negative, bilirubin 1.2, prior bilirubin 0.2, some symptoms could be secondary to intermittent biliary colic, although negative Murphy sign, no signs of peritonitis on exam, patient is comfortable in ED, urinalysis negative for signs of infection or blood, CT shows no obvious stone  Went over results with patient, will refer patient to outpatient surgical center  for evaluation for intermittent biliary colic Will prescribe bowel regimen to help relieve constipation  All radiology and laboratory studies reviewed independently and with my attending physician, agree with reading provided by radiologist unless otherwise noted.  Upon reassessing patient, patient was calm, no new complaints Based on the above findings, I believe patient is hemodynamically stable for discharge.  Patient educated about specific return precautions for given chief complaint and symptoms.  Patient educated about follow-up with PCP.  Patient expressed understanding of return precautions and need for follow-up.  Patient discharged.  The above care was discussed with and agreed upon by my attending physician. Emergency Department Medication Summary:  Medications  sodium chloride flush (NS) 0.9 % injection 3 mL (has no  administration in time range)  polyethylene glycol (MIRALAX / GLYCOLAX) packet 34 g (34 g Oral Given 05/22/19 2129)  docusate sodium (COLACE) capsule 200 mg (200 mg Oral Given 05/22/19 2129)        Final Clinical Impressions(s) / ED Diagnoses   Final diagnoses:  None    ED Discharge Orders    None       Lonzo Candy, MD 05/22/19 Mariano Colon, Kelly, DO 05/22/19 2354

## 2019-05-22 NOTE — Discharge Instructions (Signed)
Advance activities of daily living as tolerated. Stand up and ambulate with assistance until comfortable doing independently.  Advance diet as tolerated based on nausea, vomiting. Start with clear liquids and soft mechanical and advance.  Please return to ED for chest pain, shortness of breath, uncontrolled nausea, vomiting, any new concerning symptoms.  If prescribed any medications please take appropriate dose as prescribed and appropriate frequency has prescribed.  Please follow up with your PCP in 2-4 weeks or sooner if needed. Please follow-up with general surgery as needed for evaluation for gallbladder surgery

## 2019-05-22 NOTE — ED Triage Notes (Signed)
Pt from home w/ a c/o GI issues since March. She has had discomfort in the right side of her abd, issues with constipation, increase in gas, and noting green stool. The discomfort in her right side mostly occurs when she eats/drinks. It radiates into her right shoulder.

## 2019-05-26 ENCOUNTER — Encounter (HOSPITAL_COMMUNITY): Payer: Self-pay | Admitting: Emergency Medicine

## 2019-05-26 ENCOUNTER — Emergency Department (HOSPITAL_COMMUNITY): Payer: 59

## 2019-05-26 ENCOUNTER — Emergency Department (HOSPITAL_COMMUNITY)
Admission: EM | Admit: 2019-05-26 | Discharge: 2019-05-26 | Disposition: A | Discharge status: Home / Self Care | Payer: 59 | Attending: Emergency Medicine | Admitting: Emergency Medicine

## 2019-05-26 DIAGNOSIS — R1084 Generalized abdominal pain: Secondary | ICD-10-CM

## 2019-05-26 DIAGNOSIS — F17200 Nicotine dependence, unspecified, uncomplicated: Secondary | ICD-10-CM | POA: Insufficient documentation

## 2019-05-26 DIAGNOSIS — R8271 Bacteriuria: Secondary | ICD-10-CM | POA: Insufficient documentation

## 2019-05-26 DIAGNOSIS — R809 Proteinuria, unspecified: Secondary | ICD-10-CM | POA: Insufficient documentation

## 2019-05-26 LAB — URINALYSIS, ROUTINE W REFLEX MICROSCOPIC
Bilirubin Urine: NEGATIVE
Glucose, UA: NEGATIVE mg/dL
Ketones, ur: 80 mg/dL — AB
Leukocytes,Ua: NEGATIVE
Nitrite: NEGATIVE
Protein, ur: 100 mg/dL — AB
Specific Gravity, Urine: 1.029 (ref 1.005–1.030)
pH: 5 (ref 5.0–8.0)

## 2019-05-26 LAB — COMPREHENSIVE METABOLIC PANEL
ALT: 17 U/L (ref 0–44)
AST: 25 U/L (ref 15–41)
Albumin: 4.2 g/dL (ref 3.5–5.0)
Alkaline Phosphatase: 70 U/L (ref 38–126)
Anion gap: 13 (ref 5–15)
BUN: 11 mg/dL (ref 6–20)
CO2: 21 mmol/L — ABNORMAL LOW (ref 22–32)
Calcium: 9.8 mg/dL (ref 8.9–10.3)
Chloride: 105 mmol/L (ref 98–111)
Creatinine, Ser: 0.96 mg/dL (ref 0.44–1.00)
GFR calc Af Amer: >60 mL/min (ref >60)
GFR calc non Af Amer: >60 mL/min (ref >60)
Glucose, Bld: 85 mg/dL (ref 70–99)
Potassium: 4.3 mmol/L (ref 3.5–5.1)
Sodium: 139 mmol/L (ref 135–145)
Total Bilirubin: 1.5 mg/dL — ABNORMAL HIGH (ref 0.3–1.2)
Total Protein: 7.5 g/dL (ref 6.5–8.1)

## 2019-05-26 LAB — CBC
HCT: 46.7 % — ABNORMAL HIGH (ref 36.0–46.0)
Hemoglobin: 15.5 g/dL — ABNORMAL HIGH (ref 12.0–15.0)
MCH: 29.6 pg (ref 26.0–34.0)
MCHC: 33.2 g/dL (ref 30.0–36.0)
MCV: 89.3 fL (ref 80.0–100.0)
Platelets: 249 10*3/uL (ref 150–400)
RBC: 5.23 MIL/uL — ABNORMAL HIGH (ref 3.87–5.11)
RDW: 14.1 % (ref 11.5–15.5)
WBC: 9 10*3/uL (ref 4.0–10.5)
nRBC: 0 % (ref 0.0–0.2)

## 2019-05-26 LAB — I-STAT BETA HCG BLOOD, ED (MC, WL, AP ONLY): I-stat hCG, quantitative: <5 m[IU]/mL (ref <5)

## 2019-05-26 LAB — LIPASE, BLOOD: Lipase: 18 U/L (ref 11–51)

## 2019-05-26 MED ORDER — SODIUM CHLORIDE 0.9 % IV BOLUS
1000.0000 mL | Freq: Once | INTRAVENOUS | Status: AC
  Administered 2019-05-26: 1000 mL via INTRAVENOUS

## 2019-05-26 MED ORDER — ALUM & MAG HYDROXIDE-SIMETH 200-200-20 MG/5ML PO SUSP
15.0000 mL | Freq: Once | ORAL | Status: AC
  Administered 2019-05-26: 15 mL via ORAL
  Filled 2019-05-26: qty 30

## 2019-05-26 MED ORDER — IOHEXOL 300 MG/ML  SOLN
100.0000 mL | Freq: Once | INTRAMUSCULAR | Status: AC | PRN
  Administered 2019-05-26: 17:00:00 100 mL via INTRAVENOUS

## 2019-05-26 MED ORDER — DICYCLOMINE HCL 20 MG PO TABS: 20.0000 mg | ORAL_TABLET | Freq: Two times a day (BID) | ORAL | 0 refills | Status: DC

## 2019-05-26 MED ORDER — SODIUM CHLORIDE 0.9% FLUSH: 3.0000 mL | Freq: Once | INTRAVENOUS | Status: DC

## 2019-05-26 MED ORDER — FAMOTIDINE 20 MG PO TABS: 20.0000 mg | ORAL_TABLET | Freq: Once | ORAL | Status: DC

## 2019-05-26 NOTE — ED Notes (Signed)
Urine culture sent with urinalysis.  

## 2019-05-26 NOTE — ED Triage Notes (Signed)
Pt with persistent constipation after using laxatives and enemas. She reports discomfort on the left lower abdomen. Denies n/v/d or fever. When she passes stool it is watery green. A/o x4 VSS

## 2019-05-26 NOTE — ED Provider Notes (Signed)
[facility] EMERGENCY DEPARTMENT Provider Note   CSN: [id number] Arrival date & time: 05/26/19  1338     History   Chief Complaint Chief Complaint  Patient presents with   Abdominal Pain    HPI Caitlin Ingram is a 38 y.o. female.     HPI  Patient is a 38 year old female with past medical history of nephrolithiasis presenting for generalized abdominal pain, sensation of obstipation, and watery stool.  Patient reports that she has struggled with abdominal pain for couple months but it worsened over the past week.  Her pain is now more focal on the left side of her abdomen.  She has a hard time laying on her left side due to the pain.  She reports that she is mostly constipated and feels that there is an obstruction.  She is only passing small amounts of gas.  She took a laxative earlier in the week at the recommendation of emergency medicine after a visit here on 6-28, and she reports that she has had watery with green stool.  She denies any fevers.  She reports nausea without vomiting.  Denies melena or hematochezia.  Denies dysuria, urgency, frequency, vaginal discharge or vaginal bleeding.  Patient reports she is obtaining a HIDA scan in the coming week.   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      Home Medications    Prior to Admission medications   Medication Sig Start Date End Date Taking? Authorizing Provider  clindamycin (CLEOCIN) 300 MG capsule Take 1 capsule (300 mg total) by mouth 3 (three) times daily. 04/12/19   Trula Slade, DPM  [facility] CITRATE OF MAGNESIA SOLN DRINK 1 BOTTLE BY MOUTH ONCE AS 1 DOSE 04/20/19   [provider]   dicyclomine (BENTYL) 20 MG tablet Take 1 tablet (20 mg total) by mouth 2 (two) times daily. 05/26/19   Langston Masker B, PA-C  promethazine (PHENERGAN) 25 MG tablet Take 1 tablet (25 mg total) by mouth every 8 (eight) hours as needed for nausea or vomiting. 04/12/19   Trula Slade, DPM  senna-docusate (SENOKOT-S) 8.6-50 MG tablet Take 2 tablets by mouth 2 (two) times daily. 05/22/19   Lonzo Candy, MD  sodium phosphate (FLEET) 7-19 GM/118ML ENEM Place 133 mLs (1 enema total) rectally daily as needed for severe constipation. 05/22/19   Lonzo Candy, MD    Family History No family history on file.  Social History Social History   Tobacco Use   Smoking status: Current Every Day Smoker    Types: Cigars   Smokeless tobacco: Never Used  Substance Use Topics   Alcohol use: Yes    Comment: occassionally   Drug use: Yes    Types: Marijuana     Allergies   Amoxicillin and Penicillins   Review of Systems Review of Systems  Constitutional: Negative for chills and fever.  HENT: Negative for congestion and sore throat.   Eyes: Negative for visual disturbance.  Respiratory: Negative for cough, chest tightness and shortness of breath.   Cardiovascular: Negative for chest pain.  Gastrointestinal: Positive for abdominal pain, constipation, diarrhea  and nausea. Negative for blood in stool and vomiting.  Genitourinary: Negative for dysuria and flank pain.  Musculoskeletal: Negative for back pain and myalgias.  Skin: Negative for rash.  Neurological: Negative for dizziness, syncope, light-headedness and headaches.     Physical Exam Updated Vital Signs BP (!) 135/96 (BP Location: Left Arm)    Pulse 89    Temp 98.4 F (36.9 C) (Oral)    Resp 14    SpO2 100%   Physical Exam Vitals signs and nursing note reviewed.  Constitutional:      General: She is not in acute distress.    Appearance: She is well-developed.  HENT:     Head: Normocephalic and atraumatic.  Eyes:      Conjunctiva/sclera: Conjunctivae normal.     Pupils: Pupils are equal, round, and reactive to light.  Neck:     Musculoskeletal: Normal range of motion and neck supple.  Cardiovascular:     Rate and Rhythm: Normal rate and regular rhythm.     Heart sounds: S1 normal and S2 normal. No murmur.  Pulmonary:     Effort: Pulmonary effort is normal.     Breath sounds: Normal breath sounds. No wheezing or rales.  Abdominal:     General: There is no distension.     Palpations: Abdomen is soft.     Tenderness: There is abdominal tenderness in the left lower quadrant. There is no guarding.     Comments: Mild LLQ TTP without guarding or rebound.   Musculoskeletal: Normal range of motion.        General: No deformity.  Lymphadenopathy:     Cervical: No cervical adenopathy.  Skin:    General: Skin is warm and dry.     Findings: No erythema or rash.  Neurological:     Mental Status: She is alert.     Comments: Cranial nerves grossly intact. Patient moves extremities symmetrically and with good coordination.  Psychiatric:        Behavior: Behavior normal.        Thought Content: Thought content normal.        Judgment: Judgment normal.      ED Treatments / Results  Labs (all labs ordered are listed, but only abnormal results are displayed) Labs Reviewed  COMPREHENSIVE METABOLIC PANEL - Abnormal; Notable for the following components:      Result Value   CO2 21 (*)    Total Bilirubin 1.5 (*)    All other components within normal limits  CBC - Abnormal; Notable for the following components:   RBC 5.23 (*)    Hemoglobin 15.5 (*)    HCT 46.7 (*)    All other components within normal limits  URINALYSIS, ROUTINE W REFLEX MICROSCOPIC - Abnormal; Notable for the following components:   APPearance HAZY (*)    Hgb urine dipstick SMALL (*)    Ketones, ur 80 (*)    Protein, ur 100 (*)    Bacteria, UA MANY (*)    All other components within normal limits  LIPASE, BLOOD  I-STAT BETA HCG  BLOOD, ED ([facility], [facility], [facility] ONLY)    EKG None  Radiology Ct Abdomen Pelvis W Contrast  Result Date: 05/26/2019 CLINICAL DATA:  Abdominal pain, most prominent in the left lower quadrant. Constipation. EXAM: CT ABDOMEN AND PELVIS WITH CONTRAST TECHNIQUE: Multidetector CT imaging of the abdomen and pelvis was performed using the standard protocol following bolus administration of intravenous contrast. CONTRAST:  152mL OMNIPAQUE IOHEXOL 300 MG/ML  SOLN  COMPARISON:  05/22/2019 unenhanced CT abdomen/pelvis. FINDINGS: Lower chest: No significant pulmonary nodules or acute consolidative airspace disease. Hepatobiliary: Normal liver size. No liver mass. Normal gallbladder with no radiopaque cholelithiasis. No biliary ductal dilatation. Pancreas: Normal, with no mass or duct dilation. Spleen: Normal size. No mass. Adrenals/Urinary Tract: Normal adrenals. No hydronephrosis. Subcentimeter hypodense interpolar left renal cortical lesion is too small to characterize and requires no follow-up. Kidneys are normal size and demonstrate symmetric normal contrast nephrograms. Normal bladder. Stomach/Bowel: Normal non-distended stomach. Normal caliber small bowel with no small bowel wall thickening. Normal appendix. Normal large bowel with no diverticulosis, large bowel wall thickening or pericolonic fat stranding. No significant colonic stool. Vascular/Lymphatic: Normal caliber abdominal aorta. Patent portal, splenic, hepatic and renal veins. No pathologically enlarged lymph nodes in the abdomen or pelvis. Reproductive: Grossly normal uterus. No adnexal mass. Right ovarian 1.9 cm corpus luteum. Other: No pneumoperitoneum, ascites or focal fluid collection. Small fat containing umbilical hernia is stable. Musculoskeletal: No aggressive appearing focal osseous lesions. IMPRESSION: No acute abnormality. No evidence of bowel obstruction or acute bowel inflammation. Normal appendix. No significant colonic stool to suggest constipation.  Electronically Signed   By: Ilona Sorrel M.D.   On: 05/26/2019 17:25    Procedures Procedures (including critical care time)  Medications Ordered in ED Medications  sodium chloride flush (NS) 0.9 % injection 3 mL (3 mLs Intravenous Not Given 05/26/19 1656)  sodium chloride 0.9 % bolus 1,000 mL (0 mLs Intravenous Stopped 05/26/19 1751)  alum & mag hydroxide-simeth (MAALOX/MYLANTA) 200-200-20 MG/5ML suspension 15 mL (15 mLs Oral Given 05/26/19 1714)  iohexol (OMNIPAQUE) 300 MG/ML solution 100 mL (100 mLs Intravenous Contrast Given 05/26/19 1642)     Initial Impression / Assessment and Plan / ED Course  I have reviewed the triage vital signs and the nursing notes.  Pertinent labs & imaging results that were available during my care of the patient were reviewed by me and considered in my medical decision making (see chart for details).  Clinical Course as of May 25 1821  Fri May 26, 2019  1717 Pt is not having urinary symptoms.   Urinalysis, Routine w reflex microscopic(!) [AM]  1758 Up from 1.2 yesterday.   Total Bilirubin(!): 1.5 [AM]  1812 Suggestive of hemoconcentration. Pt given fluids.   Hemoglobin(!): 15.5 [AM]    Clinical Course User Index [AM] Albesa Seen, PA-C       Patient is a well appearing 38 yo female with a PMH of nephrolithiasis presenting for ongoing and worsening abdominal pain, constipation, and decreased passage of gas. Differential diagnosis includes ileus, SBO, IBS, IBD, diverticulitis, appendicitis. Will recheck labs.   Engaged in shared decision making with patient regarding imaging. She had an unremarkable CT renal stone study 5 days ago. She feels her symptoms are now worse. Discussed risk/benefits of contrasted CT imaging versus KUB and patient reports that she wants to confirm no obstruction today. She would like to proceed with contrasted CT.  Labs showing CBC remarkable for slight elevation in hemoglobin, possibly due to hemoconcentration. CMP with  slight elevation in Tbili but no other electrolyte abnormalities. Urinalysis with small hbg, 80 ketones, and proteinuria. Recommended PCP follow up for repeat UA. No anion gap or hyperglycemia.  CT abd/pelvis without acute abnormality.   Patient referred to gastroenterology for further workup. Recommended to patient stool softeners only to avoid GI irritation with stimulant laxatives. Recommended PRN Bentyl and low FODMAPs diet. Return precautions given for any new or worsening symptoms. Nursing  notes reviewed. Vital signs reviewed. All questions answered by patient.  Final Clinical Impressions(s) / ED Diagnoses   Final diagnoses:  Generalized abdominal pain  Proteinuria, unspecified type  Asymptomatic bacteriuria    ED Discharge Orders         Ordered    dicyclomine (BENTYL) 20 MG tablet  2 times daily     05/26/19 1812    Ambulatory referral to Gastroenterology     05/26/19 1812           Albesa Seen, PA-C 05/26/19 2007    Little, Wenda Overland, MD 05/26/19 [phone]

## 2019-05-26 NOTE — Discharge Instructions (Addendum)
Please see the information and instructions below regarding your visit.  Your diagnoses today include:  1. Generalized abdominal pain   2. Proteinuria, unspecified type   3. Asymptomatic bacteriuria     Your exam and testing today is reassuring that there is not a condition causing your abdominal pain that we immediately need to intervene on at this time.   Abdominal (belly) pain can be caused by many things. Your caregiver performed an examination and possibly ordered blood/urine tests and imaging (CT scan, x-rays, ultrasound). Many cases can be observed and treated at home after initial evaluation in the emergency department. Even though you are being discharged home, abdominal pain can be unpredictable. Therefore, you need a repeated exam if your pain does not resolve, returns, or worsens. Most patients with abdominal pain don't have to be admitted to the hospital or have surgery, but serious problems like appendicitis and gallbladder attacks can start out as nonspecific pain. Many abdominal conditions cannot be diagnosed in one visit, so follow-up evaluations are very important.  Tests performed today include: Blood counts and electrolytes Blood tests to check liver and kidney function Blood tests to check pancreas function Urine test to look for infection and pregnancy (in women) Vital signs. See below for your results today.   See side panel of your discharge paperwork for testing performed today. Vital signs are listed at the bottom of these instructions.   Medications prescribed:    Take any prescribed medications only as prescribed, and any over the counter medications only as directed on the packaging.  Please take Bentyl twice a day for symptoms of abdominal cramping or pain.  Home care instructions:   Please start initiating the dietary guidelines I provided.  See if these help your symptoms.  Please follow any educational materials contained in this packet.   Follow-up  instructions: Please follow-up with your primary care provider within one month for further evaluation of your symptoms if they are not completely improved.   Please follow up with gastroenterology as soon as possible.   Return instructions:  Please return to the Emergency Department if you experience worsening symptoms.  SEEK IMMEDIATE MEDICAL ATTENTION IF: The pain does not go away or becomes severe  A temperature above 101F develops  Repeated vomiting occurs (multiple episodes)  The pain becomes localized to portions of the abdomen. The right side could possibly be appendicitis. In an adult, the left lower portion of the abdomen could be colitis or diverticulitis.  Blood is being passed in stools or vomit (bright red or black tarry stools)  You develop chest pain, difficulty breathing, dizziness or fainting, or become confused, poorly responsive, or inconsolable (young children) If you have any other emergent concerns regarding your health  Additional Information:   Your vital signs today were: BP (!) 135/96 (BP Location: Left Arm)    Pulse 89    Temp 98.4 F (36.9 C) (Oral)    Resp 14    SpO2 100%  If your blood pressure (BP) was elevated on multiple readings during this visit above 130 for the top number or above 80 for the bottom number, please have this repeated by your primary care provider within one month. --------------  Thank you for allowing [place] to participate in your care today.

## 2019-05-30 ENCOUNTER — Ambulatory Visit: Payer: 59 | Attending: Family Medicine | Admitting: Family Medicine

## 2019-05-30 ENCOUNTER — Ambulatory Visit: Payer: 59

## 2019-05-30 ENCOUNTER — Other Ambulatory Visit: Payer: Self-pay

## 2019-05-30 ENCOUNTER — Encounter: Payer: Self-pay | Admitting: Family Medicine

## 2019-05-30 DIAGNOSIS — K5909 Other constipation: Secondary | ICD-10-CM

## 2019-05-30 DIAGNOSIS — R1011 Right upper quadrant pain: Secondary | ICD-10-CM

## 2019-05-30 MED ORDER — POLYETHYLENE GLYCOL 3350 17 GM/SCOOP PO POWD: 17.0000 g | Freq: Every day | ORAL | 1 refills | Status: DC

## 2019-05-30 NOTE — Progress Notes (Signed)
Patient has been called and DOB has been verified. Patient has been screened and transferred to PCP to start phone visit.    Patient has no concerns today just needs to est.care

## 2019-05-30 NOTE — Progress Notes (Signed)
Virtual Visit via Telephone Note  I connected with Caitlin Ingram, on 05/30/2019 at 9:30am by telephone due to the COVID-19 pandemic and verified that I am speaking with the correct person using two identifiers.   Consent: I discussed the limitations, risks, security and privacy concerns of performing an evaluation and management service by telephone and the availability of in person appointments. I also discussed with the patient that there may be a patient responsible charge related to this service. The patient expressed understanding and agreed to proceed.   Location of Patient: Home  Location of Provider: Clinic   Persons participating in Telemedicine visit: Emalene J Dequita Schleicher Farrington-CMA Dr. Felecia Shelling     History of Present Illness: 37-year female previously followed at [facility] who presents today to establish care after an ED visit at [facility] on 05/13/2019 for constipation and again on 05/26/2019 for generalized abdominal pain.  Labs revealed bilirubin of 1.5, elevated hemoglobin thought to be secondary to hemoconcentration.  Renal stone protocol was negative for nephrolithiasis. CT abdomen and pelvis revealed: IMPRESSION: No acute abnormality. No evidence of bowel obstruction or acute bowel inflammation. Normal appendix. No significant colonic stool to suggest constipation. She was discharged with a prescription for Bentyl.  Today she informs me symptoms have been ongoing with right upper quadrant pain which radiated to her right shoulder initially but has improved now.  She has had excessive gassiness.  Right upper quadrant pain is worse when she eats or drinks but is unsure of fatty foods and in the past she has had difficulty of food going down her throat.  She endorses some constipation which was initially thought to be secondary to allopurinol just if she received after her foot surgery but states her last bowel movement was yesterday.   She sometimes goes to the bathroom to move her bowels but then nothing comes out. Denies hematemesis, hematochezia, nausea or vomiting, fever.  Past Medical History:  Diagnosis Date  . Renal disorder    renal calculi   Allergies  Allergen Reactions  . Amoxicillin Hives  . Penicillins Hives and Itching    Current Outpatient Medications on File Prior to Visit  Medication Sig Dispense Refill  . clindamycin (CLEOCIN) 300 MG capsule Take 1 capsule (300 mg total) by mouth 3 (three) times daily. (Patient not taking: Reported on 05/30/2019) 30 capsule 0  . [facility] CITRATE OF MAGNESIA SOLN DRINK 1 BOTTLE BY MOUTH ONCE AS 1 DOSE    . dicyclomine (BENTYL) 20 MG tablet Take 1 tablet (20 mg total) by mouth 2 (two) times daily. (Patient not taking: Reported on 05/30/2019) 20 tablet 0  . promethazine (PHENERGAN) 25 MG tablet Take 1 tablet (25 mg total) by mouth every 8 (eight) hours as needed for nausea or vomiting. (Patient not taking: Reported on 05/30/2019) 20 tablet 0  . senna-docusate (SENOKOT-S) 8.6-50 MG tablet Take 2 tablets by mouth 2 (two) times daily. (Patient not taking: Reported on 05/30/2019) 3 tablet 0  . sodium phosphate (FLEET) 7-19 GM/118ML ENEM Place 133 mLs (1 enema total) rectally daily as needed for severe constipation. (Patient not taking: Reported on 05/30/2019) 230 mL 0   No current facility-administered medications on file prior to visit.     Observations/Objective: Awake, alert, oriented x3 Not in acute distress  Assessment and Plan: 1. RUQ pain CT abdomen unrevealing We will need to evaluate for gallbladder pathology If symptoms persist and work-up is negative, will consider referral to GI - US Abdomen Limited  RUQ; Future - H. pylori breath test  2. Other constipation Counseled on increasing fiber intake, increase water intake - polyethylene glycol powder (GLYCOLAX/MIRALAX) 17 GM/SCOOP powder; Take 17 g by mouth daily.  Dispense: 3350 g; Refill: 1   Follow Up  Instructions: 1 month   I discussed the assessment and treatment plan with the patient. The patient was provided an opportunity to ask questions and all were answered. The patient agreed with the plan and demonstrated an understanding of the instructions.   The patient was advised to call back or seek an in-person evaluation if the symptoms worsen or if the condition fails to improve as anticipated.     I provided 20 minutes total of non-face-to-face time during this encounter including median intraservice time, reviewing previous notes, labs, imaging, medications, management and patient verbalized understanding.     Charlott Rakes, MD, FAAFP. [facility] and [facility], [place]   05/30/2019, 11:25 AM

## 2019-06-01 ENCOUNTER — Other Ambulatory Visit: Payer: Self-pay

## 2019-06-01 ENCOUNTER — Other Ambulatory Visit: Payer: Self-pay | Admitting: Family Medicine

## 2019-06-01 ENCOUNTER — Ambulatory Visit (HOSPITAL_COMMUNITY)
Admission: RE | Admit: 2019-06-01 | Discharge: 2019-06-01 | Disposition: A | Discharge status: Home / Self Care | Payer: 59 | Source: Ambulatory Visit | Attending: Family Medicine | Admitting: Family Medicine

## 2019-06-01 DIAGNOSIS — R1011 Right upper quadrant pain: Secondary | ICD-10-CM | POA: Diagnosis not present

## 2019-06-01 LAB — H. PYLORI BREATH TEST: H pylori Breath Test: NEGATIVE

## 2019-06-01 MED ORDER — OMEPRAZOLE 40 MG PO CPDR: 40.0000 mg | DELAYED_RELEASE_CAPSULE | Freq: Every day | ORAL | 1 refills | Status: DC

## 2019-06-05 ENCOUNTER — Encounter: Payer: Self-pay | Admitting: Podiatry

## 2019-06-05 ENCOUNTER — Ambulatory Visit (INDEPENDENT_AMBULATORY_CARE_PROVIDER_SITE_OTHER): Payer: 59

## 2019-06-05 ENCOUNTER — Other Ambulatory Visit: Payer: Self-pay

## 2019-06-05 ENCOUNTER — Ambulatory Visit (INDEPENDENT_AMBULATORY_CARE_PROVIDER_SITE_OTHER): Payer: 59 | Admitting: Podiatry

## 2019-06-05 VITALS — Temp 98.1°F

## 2019-06-05 DIAGNOSIS — M2012 Hallux valgus (acquired), left foot: Secondary | ICD-10-CM

## 2019-06-05 DIAGNOSIS — M2041 Other hammer toe(s) (acquired), right foot: Secondary | ICD-10-CM

## 2019-06-05 DIAGNOSIS — M2042 Other hammer toe(s) (acquired), left foot: Secondary | ICD-10-CM

## 2019-06-07 NOTE — Progress Notes (Signed)
Subjective: Caitlin Ingram is a 38 y.o. is seen today in office s/p left foot lapidusbunionectomy and fifth digit PIPJ arthroplasty preformed on 04/12/2019.  Overall she states that she is doing well.  She denies any significant discomfort she is still walking in the cam boot.  No recent injury or falls and she has no new concerns. Denies any systemic complaints such as fevers, chills, nausea, vomiting. No calf pain, chest pain, shortness of breath.   Objective: General: No acute distress, AAOx3  DP/PT pulses palpable 2/4, CRT < 3 sec to all digits.  Protective sensation intact. Motor function intact.  LEFT foot: Incision is well coapted without any evidence of dehiscence and is scar is formed.  There is a scab on the proximal aspect which removed The sutures I cut out.  There is no opening or signs of dehiscence.  There is no surrounding erythema, ascending cellulitis.  No fluctuation crepitation any malodor.  No other open lesions or pre-ulcerative lesions.  No pain with calf compression, swelling, warmth, erythema.   Assessment and Plan:  Status post left foot surgery, doing well with no complications   -Treatment options discussed including all alternatives, risks, and complications -X-rays reviewed.  Hardware intact.  Status post Lapidus bunionectomy as well as fifth digit arthroplasty.  No evidence of acute fracture.  There is improvement in the lucency present the second metatarsal diaphysis but no overt fracture. -I removed suture knot from proximal aspect incision.  Incision appears to be healing well.  Continue antibiotic ointment dressing changes daily.  Continue cam boot for now.  I want to start range of motion exercises. Continue CAM boot  Return in about 3 weeks (around 06/26/2019).  Trula Slade DPM

## 2019-06-12 ENCOUNTER — Telehealth: Payer: Self-pay | Admitting: General Surgery

## 2019-06-12 NOTE — Telephone Encounter (Signed)
Covid-19 screening questions   Do you now or have you had a fever in the last 14 days?no  Do you have any respiratory symptoms of shortness of breath or cough now or in the last 14 days? NO  Do you have any family members or close contacts with diagnosed or suspected Covid-19 in the past 14 days? NO  Have you been tested for Covid-19 and found to be positive? NO   Patient instructed for her and healthcare partner to wear a mask to the office. The patient verbalized understanding.

## 2019-06-12 NOTE — Telephone Encounter (Signed)
Left a voicemail on the patients mobile # to call for Covid-prescreen. The patient is coming into the office for a visit on 06/13/2019/

## 2019-06-13 ENCOUNTER — Encounter

## 2019-06-13 ENCOUNTER — Ambulatory Visit: Payer: 59 | Admitting: Physician Assistant

## 2019-06-13 ENCOUNTER — Encounter: Payer: Self-pay | Admitting: Physician Assistant

## 2019-06-13 VITALS — BP 122/76 | HR 60 | Temp 98.4°F | Ht 65.5 in | Wt 192.2 lb

## 2019-06-13 DIAGNOSIS — K5900 Constipation, unspecified: Secondary | ICD-10-CM

## 2019-06-13 DIAGNOSIS — R1010 Upper abdominal pain, unspecified: Secondary | ICD-10-CM | POA: Diagnosis not present

## 2019-06-13 DIAGNOSIS — R143 Flatulence: Secondary | ICD-10-CM | POA: Diagnosis not present

## 2019-06-13 NOTE — Patient Instructions (Signed)
If you are age 39 or older, your body mass index should be between 23-30. Your Body mass index is 31.51 kg/m. If this is out of the aforementioned range listed, please consider follow up with your Primary Care Provider.  If you are age 44 or younger, your body mass index should be between 19-25. Your Body mass index is 31.51 kg/m. If this is out of the aformentioned range listed, please consider follow up with your Primary Care Provider.   Increase Fiber in diet.  Increase water intake to 60 ounces daily.  Start Miralax 17 gm in 8 ounces of water daily.  Follow up with me as needed.  Thank you for choosing me and [facility].   Amy Esterwood, PA-C

## 2019-06-13 NOTE — Progress Notes (Addendum)
Subjective:    Patient ID: Caitlin Ingram, female    DOB: [birth year], 38 y.o.   MRN: [id number]  HPI Wyatt Mage is a pleasant 38 year old African-American female, new to GI today referred by the emergency room after visit on 05/26/2019.  She has not had any prior GI evaluation.  She has history of prior kidney stones, vitamin D deficiency and underwent a bunionectomy in May 2020 for which she is still recovering. Patient says her current symptoms started in April 2020.  She says she had had some significant dietary changes had been eating a lot of bread etc.  She then underwent the bunionectomy in May 2020.  She had been taking ibuprofen regularly at least to every 6 hours for several weeks after her surgery, as she was trying to avoid narcotics.  She says around that time she is noticed more gassiness and some gassy type of discomfort in her back and some upper abdominal discomfort.  She had been constipated and darted using Epson salts as a laxative on a as needed basis.  She had no associated nausea or vomiting, no hematochezia. She underwent upper abdominal ultrasound on 05/26/2019 at the ER visit which was negative, and also had CT of the abdomen and pelvis with contrast which was negative.  CBC normal except hemoglobin 15.5 c-Met unremarkable UA showed a few WBCs, she did not have urine culture, had no symptoms and currently has no dysuria. She was seen by her PCP and had an H. pylori breath test done which was negative. Over the past couple of weeks she has been feeling better and is having much more regular bowel movements.  She says she is still gassy and a little bit bloated but is not having any abdominal discomfort he had noticed some increase straining for bowel movements but is having a bowel movement every 1 to 2 days.  She is back on her regular diet. Family history negative for GI disease as far she is aware.  Review of Systems Pertinent positive and negative review of systems were  noted in the above HPI section.  All other review of systems was otherwise negative.  Outpatient Encounter Medications as of 06/13/2019  Medication Sig  . polyethylene glycol powder (GLYCOLAX/MIRALAX) 17 GM/SCOOP powder Take 17 g by mouth daily.  . Vitamin D, Ergocalciferol, (DRISDOL) 1.25 MG (50000 UT) CAPS capsule Take 50,000 Units by mouth once a week.  . [facility] CITRATE OF MAGNESIA SOLN DRINK 1 BOTTLE BY MOUTH ONCE AS 1 DOSE  . dicyclomine (BENTYL) 20 MG tablet Take 1 tablet (20 mg total) by mouth 2 (two) times daily. (Patient not taking: Reported on 05/30/2019)  . omeprazole (PRILOSEC) 40 MG capsule Take 1 capsule (40 mg total) by mouth daily. (Patient not taking: Reported on 06/13/2019)  . promethazine (PHENERGAN) 25 MG tablet Take 1 tablet (25 mg total) by mouth every 8 (eight) hours as needed for nausea or vomiting. (Patient not taking: Reported on 05/30/2019)  . senna-docusate (SENOKOT-S) 8.6-50 MG tablet Take 2 tablets by mouth 2 (two) times daily. (Patient not taking: Reported on 05/30/2019)  . sodium phosphate (FLEET) 7-19 GM/118ML ENEM Place 133 mLs (1 enema total) rectally daily as needed for severe constipation. (Patient not taking: Reported on 05/30/2019)  . [DISCONTINUED] clindamycin (CLEOCIN) 300 MG capsule Take 1 capsule (300 mg total) by mouth 3 (three) times daily. (Patient not taking: Reported on 05/30/2019)   No facility-administered encounter medications on file as of 06/13/2019.    Allergies  Allergen  Reactions  . Amoxicillin Hives  . Penicillins Hives and Itching   Patient Active Problem List   Diagnosis Date Noted  . Valgus deformity of both great toes 01/11/2019  . Pain in right knee 03/03/2018   Social History   Socioeconomic History  . Marital status: Single    Spouse name: Not on file  . Number of children: 0  . Years of education: Not on file  . Highest education level: Not on file  Occupational History  . Not on file  Social Needs  . Financial resource strain: Not  on file  . Food insecurity    Worry: Not on file    Inability: Not on file  . Transportation needs    Medical: Not on file    Non-medical: Not on file  Tobacco Use  . Smoking status: Current Every Day Smoker    Types: Cigars  . Smokeless tobacco: Never Used  Substance and Sexual Activity  . Alcohol use: Yes    Comment: occassionally  . Drug use: Yes    Types: Marijuana  . Sexual activity: Not on file  Lifestyle  . Physical activity    Days per week: Not on file    Minutes per session: Not on file  . Stress: Not on file  Relationships  . Social Herbalist on phone: Not on file    Gets together: Not on file    Attends religious service: Not on file    Active member of club or organization: Not on file    Attends meetings of clubs or organizations: Not on file    Relationship status: Not on file  . Intimate partner violence    Fear of current or ex partner: Not on file    Emotionally abused: Not on file    Physically abused: Not on file    Forced sexual activity: Not on file  Other Topics Concern  . Not on file  Social History Narrative  . Not on file    Ms. Mees's family history includes Diabetes in her brother; Hypertension in her father and mother.      Objective:    Vitals:   06/13/19 0852  BP: 122/76  Pulse: 60  Temp: 98.4 F (36.9 C)    Physical Exam Well-developed well-nourished African-American female in no acute distress.   Weight 192, BMI 31.5  HEENT; nontraumatic normocephalic, EOMI, PE R LA, sclera anicteric. Oropharynx; not examined/wearing mass/COVID Neck; supple, no JVD Cardiovascular; regular rate and rhythm with S1-S2, no murmur rub or gallop Pulmonary; Clear bilaterally Abdomen; soft, nontender, nondistended, no palpable mass or hepatosplenomegaly, bowel sounds are active Rectal; not done today Skin; benign exam, no jaundice rash or appreciable lesions Extremities; no clubbing cyanosis or edema skin warm and dry wearing a  boot on left lower extremity Neuro/Psych; alert and oriented x4, grossly nonfocal mood and affect appropriate       Assessment & Plan:   #59 38 year old African-American female with recent ER visit with abdominal pain.  Upper abdominal pain has resolved, she had also been constipated.  She had purged her bowel since and is now having more regular bowel movements. Suspect her symptoms were precipitated by alteration in diet, decreased mobility after bunionectomy, and significant NSAID use. She likely had acute gastropathy in addition to constipation.  All symptoms significantly improved.  #2 history of nephrolithiasis 3.  Left foot bunionectomy May 2020  Plan; We discussed general management for constipation.  She is advised to  increase water intake to 60 ounces per day. Add MiraLAX 17 g in 8 ounces of water daily or every other day regularly Avoid NSAIDs  Patient will be established with Dr. Hilarie Fredrickson.  She will follow-up with Dr. Hilarie Fredrickson or myself on an as-needed basis.  Gerritt Galentine S Kalli Greenfield PA-C 06/13/2019   Cc: Albesa Seen, PA-C   /Addendum: Reviewed and agree with assessment and management plan. Pyrtle, Lajuan Lines, MD

## 2019-06-14 ENCOUNTER — Encounter: Payer: Self-pay | Admitting: Podiatry

## 2019-06-21 ENCOUNTER — Ambulatory Visit: Payer: 59 | Admitting: Family Medicine

## 2019-06-21 ENCOUNTER — Other Ambulatory Visit: Payer: Self-pay

## 2019-06-21 ENCOUNTER — Encounter: Payer: Self-pay | Admitting: Family Medicine

## 2019-06-21 VITALS — BP 132/92 | HR 79 | Temp 99.0°F | Resp 17 | Ht 65.5 in | Wt 186.4 lb

## 2019-06-21 DIAGNOSIS — R808 Other proteinuria: Secondary | ICD-10-CM | POA: Diagnosis not present

## 2019-06-21 DIAGNOSIS — F17200 Nicotine dependence, unspecified, uncomplicated: Secondary | ICD-10-CM

## 2019-06-21 DIAGNOSIS — K5901 Slow transit constipation: Secondary | ICD-10-CM

## 2019-06-21 LAB — POCT URINALYSIS DIP (MANUAL ENTRY)
Bilirubin, UA: NEGATIVE
Glucose, UA: NEGATIVE mg/dL
Ketones, POC UA: NEGATIVE mg/dL
Leukocytes, UA: NEGATIVE
Nitrite, UA: NEGATIVE
Protein Ur, POC: NEGATIVE mg/dL
Spec Grav, UA: 1.02 (ref 1.010–1.025)
Urobilinogen, UA: 0.2 E.U./dL
pH, UA: 7 (ref 5.0–8.0)

## 2019-06-21 MED ORDER — ALIGN PREBIOTIC-PROBIOTIC 5-1.25 MG-GM PO CHEW: 1.0000 | CHEWABLE_TABLET | Freq: Every day | ORAL | Status: DC

## 2019-06-21 NOTE — Progress Notes (Signed)
New Patient Office Visit  Subjective:  Patient ID: Caitlin Ingram, female    DOB: [birth year]  Age: 38 y.o. MRN: [id number]  CC:  Chief Complaint  Patient presents with  . New Patient (Initial Visit)    establish care.  Issues with bm's- feels like she has to strain to go, taking stool softners/laxatives to help, helped for a while but continues, per pt lots of gas buildup, feels like gas is trapped in back.  Onset: after bunion surgery.  Changed diet to help with bm's, not drinking alot of water and drinks red wine unsure it this is the cause.  Back forth to ED for issue - unable to find anything all test come back norma.    HPI Caitlin Ingram presents for    Constipation Patient reports that she started having constipation after bunionectomy She was taking 4 ibuprofen 234m  Every six hours She did not take any opiates She had frequent ER visits and even a visit to [facility] for upper abdominal pain and constipation She was advised to start on miralax and advised to follow up in her pain got worse She states that she continues to have a sensation of abdominal gas and the gas x and miralax are not helping She is walking for exercise  She states that she no longer needs the laxatives She states that her last dose of laxative was a week ago and was epsom salt She is not taking a probiotic She denies nausea or vomiting  She feels like she has to strain to have a bowel movement She is not drinking a lot of water - she is drinking about 8-12 oz a day She drinks red wine about one glass a week but that has not been recent She drinks coffee. She uses colace for BM  She denies blood per rectum She denies history of hemorrhoids  Tobacco Use She smokes black and mild cigars For 10 years She denies asthma She only smokes one a day She has stopped in the past for 5-6 years   Past Medical History:  Diagnosis Date  . Kidney stones   . Renal disorder    renal calculi     Past Surgical History:  Procedure Laterality Date  . BUNIONECTOMY Left   . CYST REMOVAL NECK  1990    Family History  Problem Relation Age of Onset  . Hypertension Mother   . Hypertension Father   . Diabetes Brother     Social History   Socioeconomic History  . Marital status: Single    Spouse name: Not on file  . Number of children: 0  . Years of education: Not on file  . Highest education level: Not on file  Occupational History  . Not on file  Social Needs  . Financial resource strain: Not on file  . Food insecurity    Worry: Not on file    Inability: Not on file  . Transportation needs    Medical: Not on file    Non-medical: Not on file  Tobacco Use  . Smoking status: Current Every Day Smoker    Types: Cigars  . Smokeless tobacco: Never Used  Substance and Sexual Activity  . Alcohol use: Yes    Comment: occassionally  . Drug use: Yes    Types: Marijuana  . Sexual activity: Not on file  Lifestyle  . Physical activity    Days per week: Not on file    Minutes per session:  Not on file  . Stress: Not on file  Relationships  . Social Herbalist on phone: Not on file    Gets together: Not on file    Attends religious service: Not on file    Active member of club or organization: Not on file    Attends meetings of clubs or organizations: Not on file    Relationship status: Not on file  . Intimate partner violence    Fear of current or ex partner: Not on file    Emotionally abused: Not on file    Physically abused: Not on file    Forced sexual activity: Not on file  Other Topics Concern  . Not on file  Social History Narrative  . Not on file    ROS Review of Systems Review of Systems  Constitutional: Negative for activity change, appetite change, chills and fever.  HENT: Negative for congestion, nosebleeds, trouble swallowing and voice change.   Respiratory: Negative for cough, shortness of breath and wheezing.   Gastrointestinal:  Negative for diarrhea, nausea and vomiting.  See hpi Genitourinary: Negative for difficulty urinating, dysuria, flank pain and hematuria.  Musculoskeletal: Negative for back pain, joint swelling and neck pain.  Neurological: Negative for dizziness, speech difficulty, light-headedness and numbness.  See HPI. All other review of systems negative.   Objective:   Today's Vitals: BP 132/90 (BP Location: Left Arm, Patient Position: Sitting, Cuff Size: Large)   Pulse 79   Temp 99 F (37.2 C) (Oral)   Resp 17   Ht 5' 5.5" (1.664 m)   Wt 186 lb 6.4 oz (84.6 kg)   LMP 06/07/2019   SpO2 98%   BMI 30.55 kg/m   Physical Exam   Physical Exam  Constitutional: Oriented to person, place, and time. Appears well-developed and well-nourished.  HENT:  Head: Normocephalic and atraumatic.  Eyes: Conjunctivae and EOM are normal.  Neck: supple, no thyromegaly Cardiovascular: Normal rate, regular rhythm, normal heart sounds and intact distal pulses.  No murmur heard. Pulmonary/Chest: Effort normal and breath sounds normal. No stridor. No respiratory distress. Has no wheezes.  Neurological: Is alert and oriented to person, place, and time.  Skin: Skin is warm. Capillary refill takes less than 2 seconds.  Psychiatric: Has a normal mood and affect. Behavior is normal. Judgment and thought content normal.    Assessment & Plan:   Problem List Items Addressed This Visit    None    Visit Diagnoses    Other proteinuria    -  Primary Will recheck Increase hydration  Will monitor closely   Relevant Orders   CMP14+EGFR   POCT urinalysis dipstick   Slow transit constipation      Constipation  Stop the colace Starting metamucil fiber 1 scoop dissolved in 8 ounces of water Drink about 48 ounces of water in addition to the water with the metamucil  Take align probiotic   At lunch take a miralax  Decrease the miralax to 2-3 times a week  Discussed that her BM should be dense and does not have to  be daily     Relevant Medications   Bacillus Coagulans-Inulin (ALIGN PREBIOTIC-PROBIOTIC) 5-1.25 MG-GM CHEW   Other Relevant Orders   TSH   CMP14+EGFR   Tobacco use disorder    -  Advised smoking cessation  Discussed that long term smoking can impact the mesenteric blood flow        Outpatient Encounter Medications as of 06/21/2019  Medication Sig  .  [facility]S CITRATE OF MAGNESIA SOLN DRINK 1 BOTTLE BY MOUTH ONCE AS 1 DOSE  . omeprazole (PRILOSEC) 40 MG capsule Take 1 capsule (40 mg total) by mouth daily.  . polyethylene glycol powder (GLYCOLAX/MIRALAX) 17 GM/SCOOP powder Take 17 g by mouth daily.  . promethazine (PHENERGAN) 25 MG tablet Take 1 tablet (25 mg total) by mouth every 8 (eight) hours as needed for nausea or vomiting.  . senna-docusate (SENOKOT-S) 8.6-50 MG tablet Take 2 tablets by mouth 2 (two) times daily.  . sodium phosphate (FLEET) 7-19 GM/118ML ENEM Place 133 mLs (1 enema total) rectally daily as needed for severe constipation.  . Bacillus Coagulans-Inulin (ALIGN PREBIOTIC-PROBIOTIC) 5-1.25 MG-GM CHEW Chew 1 capsule by mouth daily.  Marland Kitchen dicyclomine (BENTYL) 20 MG tablet Take 1 tablet (20 mg total) by mouth 2 (two) times daily. (Patient not taking: Reported on 05/30/2019)  . Vitamin D, Ergocalciferol, (DRISDOL) 1.25 MG (50000 UT) CAPS capsule Take 50,000 Units by mouth once a week.   No facility-administered encounter medications on file as of 06/21/2019.     Follow-up: No follow-ups on file.   Forrest Moron, MD

## 2019-06-21 NOTE — Patient Instructions (Addendum)
Constipation  Stopping the colace Starting metamucil fiber 1 scoop dissolved in 8 ounces of water After you drink metamucil drink a glass of water  Eat your breakfast Take your align probiotic   At lunch take a miralax  Decrease the miralax to 2-3 times a week  You should start to notice a bigger more formed bowel movement and it is okay if this only occurs every 2-3 days.    If you have lab work done today you will be contacted with your lab results within the next 2 weeks.  If you have not heard from [place] then please contact us. The fastest way to get your results is to register for My Chart.   IF you received an x-ray today, you will receive an invoice from [facility]. Please contact [facility] at [phone] with questions or concerns regarding your invoice.   IF you received labwork today, you will receive an invoice from [facility]. Please contact [facility] at [phone] with questions or concerns regarding your invoice.   Our billing staff will not be able to assist you with questions regarding bills from these companies.  You will be contacted with the lab results as soon as they are available. The fastest way to get your results is to activate your My Chart account. Instructions are located on the last page of this paperwork. If you have not heard from [place] regarding the results in 2 weeks, please contact this office.     Constipation, Adult Constipation is when a person has fewer bowel movements in a week than normal, has difficulty having a bowel movement, or has stools that are dry, hard, or larger than normal. Constipation may be caused by an underlying condition. It may become worse with age if a person takes certain medicines and does not take in enough fluids. Follow these instructions at home: Eating and drinking   Eat foods that have a lot of fiber, such as fresh fruits and vegetables, whole grains, and beans.  Limit foods that are high in fat,  low in fiber, or overly processed, such as french fries, hamburgers, cookies, candies, and soda.  Drink enough fluid to keep your urine clear or pale yellow. General instructions  Exercise regularly or as told by your health care provider.  Go to the restroom when you have the urge to go. Do not hold it in.  Take over-the-counter and prescription medicines only as told by your health care provider. These include any fiber supplements.  Practice pelvic floor retraining exercises, such as deep breathing while relaxing the lower abdomen and pelvic floor relaxation during bowel movements.  Watch your condition for any changes.  Keep all follow-up visits as told by your health care provider. This is important. Contact a health care provider if:  You have pain that gets worse.  You have a fever.  You do not have a bowel movement after 4 days.  You vomit.  You are not hungry.  You lose weight.  You are bleeding from the anus.  You have thin, pencil-like stools. Get help right away if:  You have a fever and your symptoms suddenly get worse.  You leak stool or have blood in your stool.  Your abdomen is bloated.  You have severe pain in your abdomen.  You feel dizzy or you faint. This information is not intended to replace advice given to you by your health care provider. Make sure you discuss any questions you have with your health care provider.  Document Released: 08/07/2004 Document Revised: 10/22/2017 Document Reviewed: 04/29/2016 Elsevier Patient Education  2020 [employer].

## 2019-06-22 LAB — CMP14+EGFR
ALT: 12 IU/L (ref 0–32)
AST: 19 IU/L (ref 0–40)
Albumin/Globulin Ratio: 1.5 (ref 1.2–2.2)
Albumin: 4.3 g/dL (ref 3.8–4.8)
Alkaline Phosphatase: 94 IU/L (ref 39–117)
BUN/Creatinine Ratio: 11 (ref 9–23)
BUN: 9 mg/dL (ref 6–20)
Bilirubin Total: 0.4 mg/dL (ref 0.0–1.2)
CO2: 20 mmol/L (ref 20–29)
Calcium: 9.6 mg/dL (ref 8.7–10.2)
Chloride: 102 mmol/L (ref 96–106)
Creatinine, Ser: 0.84 mg/dL (ref 0.57–1.00)
GFR calc Af Amer: 102 mL/min/{1.73_m2} (ref >59)
GFR calc non Af Amer: 88 mL/min/{1.73_m2} (ref >59)
Globulin, Total: 2.9 g/dL (ref 1.5–4.5)
Glucose: 88 mg/dL (ref 65–99)
Potassium: 5 mmol/L (ref 3.5–5.2)
Sodium: 139 mmol/L (ref 134–144)
Total Protein: 7.2 g/dL (ref 6.0–8.5)

## 2019-06-22 LAB — TSH: TSH: 0.819 u[IU]/mL (ref 0.450–4.500)

## 2019-06-26 ENCOUNTER — Ambulatory Visit (INDEPENDENT_AMBULATORY_CARE_PROVIDER_SITE_OTHER): Payer: 59

## 2019-06-26 ENCOUNTER — Ambulatory Visit (INDEPENDENT_AMBULATORY_CARE_PROVIDER_SITE_OTHER): Payer: 59 | Admitting: Podiatry

## 2019-06-26 ENCOUNTER — Other Ambulatory Visit: Payer: Self-pay

## 2019-06-26 DIAGNOSIS — M2012 Hallux valgus (acquired), left foot: Secondary | ICD-10-CM

## 2019-06-26 DIAGNOSIS — M2042 Other hammer toe(s) (acquired), left foot: Secondary | ICD-10-CM

## 2019-06-26 DIAGNOSIS — M2041 Other hammer toe(s) (acquired), right foot: Secondary | ICD-10-CM

## 2019-06-28 NOTE — Progress Notes (Signed)
Subjective: Caitlin Ingram is a 38 y.o. is seen today in office s/p left foot lapidusbunionectomy and fifth digit PIPJ arthroplasty preformed on 04/12/2019.  She does feel that she is making progress and she feels it is healing.  After I last saw her she did go to a wedding and she wore flat shoe and she states that after which she did have pain and swelling to the foot and she took some time to get over this but overall she feels that she is making progress.  Her second toe extending somewhat dorsiflexed she describes.  She is been trying to work on range of motion to her big toe joint at home as well.  No recent injury or falls.  Swelling is improving. Denies any systemic complaints such as fevers, chills, nausea, vomiting. No calf pain, chest pain, shortness of breath.   Objective: General: No acute distress, AAOx3  DP/PT pulses palpable 2/4, CRT < 3 sec to all digits.  Protective sensation intact. Motor function intact.  LEFT foot: Incision is well coapted without any evidence of dehiscence and is scar is formed.  The scab is present previously has resolved.  There is mild tenderness palpation at surgical site.  There is mild range of motion restriction of the first MPJ.  The second toe deficit is slightly dorsiflexed position.  There is still swelling of a mild to the foot.  There is no erythema or warmth.  The fifth toe is doing well in a rectus position.  No other open lesions or pre-ulcerative lesions.  No pain with calf compression, swelling, warmth, erythema.   Assessment and Plan:  Status post left foot surgery, doing well with no complications   -Treatment options discussed including all alternatives, risks, and complications -X-rays reviewed.  Hardware intact.  Status post Lapidus bunionectomy as well as fifth digit arthroplasty.  No evidence of acute fracture.  There is still some lucency on the arthrodesis site but there does appear to be some increased consolidation.  No evidence of  acute fracture. -At this time she is walking in the cam boot and physical therapy and prescription for [facility] was written.  Continue ice elevation.  As she progresses with physical therapy she can start to transition to a stiffer bottom sneaker. -Discussed range of motion exercises for the MPJs to continue as well.  Consider toe regulator for a second toe if needed. -She is still not able to return to work given discomfort and swelling.    Return in about 3 weeks (around 07/17/2019).  Repeat x-rays  Trula Slade DPM

## 2019-07-17 ENCOUNTER — Other Ambulatory Visit: Payer: Self-pay

## 2019-07-17 ENCOUNTER — Ambulatory Visit: Payer: 59 | Admitting: Podiatry

## 2019-07-17 ENCOUNTER — Encounter: Payer: Self-pay | Admitting: Family Medicine

## 2019-07-17 ENCOUNTER — Ambulatory Visit (INDEPENDENT_AMBULATORY_CARE_PROVIDER_SITE_OTHER): Payer: 59

## 2019-07-17 VITALS — Temp 97.2°F

## 2019-07-17 DIAGNOSIS — M2012 Hallux valgus (acquired), left foot: Secondary | ICD-10-CM

## 2019-07-17 NOTE — Progress Notes (Signed)
Subjective: Caitlin Ingram is a 38 y.o. is seen today in office s/p left foot lapidus bunionectomy and fifth digit PIPJ arthroplasty preformed on 04/12/2019.  She has not yet started physical therapy.  She says at times her foot will become uncomfortable as well but not needing any pain medication.  No recent injury or falls.  She still in the cam boot.  She states that she is not ready go back to work.  She has ordered stilted shoes and stand for several hours a day.  Denies any systemic complaints such as fevers, chills, nausea, vomiting. No calf pain, chest pain, shortness of breath.   Objective: General: No acute distress, AAOx3  DP/PT pulses palpable 2/4, CRT < 3 sec to all digits.  Protective sensation intact. Motor function intact.  LEFT foot: Incision is well coapted without any evidence of dehiscence and is scar is formed.  There is mild swelling along the surgical site. No significant pinpoint pain to the surgical site or the 2nd metatarsal.  The 2nd toe is still down and in a rectus position. No pain to the 5th toe and is rectus.  No other open lesions or pre-ulcerative lesions.  No pain with calf compression, swelling, warmth, erythema.   Assessment and Plan:  Status post left foot surgery, doing well with no complications   -Treatment options discussed including all alternatives, risks, and complications -X-rays reviewed.  Hardware intact.  Status post Lapidus bunionectomy as well as fifth digit arthroplasty.  No evidence of acute fracture. There is increased consolidation across the arthrodesis site.  -Will start PT- new order written today.  -Ice -New compression ankle discussed  Return in about 4 weeks (around 08/14/2019). Repeat x-rays  Trula Slade DPM

## 2019-07-29 ENCOUNTER — Encounter: Payer: Self-pay | Admitting: Family Medicine

## 2019-08-14 ENCOUNTER — Ambulatory Visit (INDEPENDENT_AMBULATORY_CARE_PROVIDER_SITE_OTHER): Payer: Self-pay

## 2019-08-14 ENCOUNTER — Ambulatory Visit (INDEPENDENT_AMBULATORY_CARE_PROVIDER_SITE_OTHER): Payer: Self-pay | Admitting: Podiatry

## 2019-08-14 ENCOUNTER — Encounter: Payer: Self-pay | Admitting: Podiatry

## 2019-08-14 ENCOUNTER — Other Ambulatory Visit: Payer: Self-pay

## 2019-08-14 DIAGNOSIS — M2042 Other hammer toe(s) (acquired), left foot: Secondary | ICD-10-CM

## 2019-08-14 DIAGNOSIS — Z09 Encounter for follow-up examination after completed treatment for conditions other than malignant neoplasm: Secondary | ICD-10-CM

## 2019-08-14 DIAGNOSIS — M2012 Hallux valgus (acquired), left foot: Secondary | ICD-10-CM

## 2019-08-15 NOTE — Progress Notes (Signed)
Subjective: Caitlin Ingram is a 38 y.o. is seen today in office s/p left foot lapidus bunionectomy and fifth digit PIPJ arthroplasty preformed on 04/12/2019.  Unfortunately he is only been a physical therapy one time due to insurance issues.  She states that she has not been doing a lot of therapy at home because she states she will swell when she does will become comfortable.  She is still wearing the cam boot.  She has gone left foot at times.  Ice has been helpful.  Denies any systemic complaints such as fevers, chills, nausea, vomiting. No calf pain, chest pain, shortness of breath.   Objective: General: No acute distress, AAOx3  DP/PT pulses palpable 2/4, CRT < 3 sec to all digits.  Protective sensation intact. Motor function intact.  LEFT foot: Incision is well coapted without any evidence of dehiscence and is scar is formed.  There is mild swelling along the surgical site however not significant today. No significant pinpoint pain to the surgical site or the 2nd metatarsal.  The 2nd toe is still down and in a rectus position. No pain to the 5th toe and is rectus.  No significant tenderness palpation at surgical site today. No other open lesions or pre-ulcerative lesions.  No pain with calf compression, swelling, warmth, erythema.   Assessment and Plan:  Status post left foot surgery  -Treatment options discussed including all alternatives, risks, and complications -X-rays reviewed.  Hardware intact.  Status post Lapidus bunionectomy as well as fifth digit arthroplasty.  Does appear to release partial consolidation across the arthrodesis site. -Discussed with her continue range of motion exercises at home.  She also developed equinus.  Hopefully as she continues with home physical therapy she can start to transition to regular shoe.  Continue with present ankle.  Ice and elevation.  Return in about 4 weeks (around 09/11/2019).  Trula Slade DPM     No evidence of acute fracture.  There is increased consolidation across the arthrodesis site.  -Will start PT- new order written today.  -Ice -New compression ankle discussed  Return in about 4 weeks (around 08/14/2019). Repeat x-rays  Trula Slade DPM

## 2019-08-18 ENCOUNTER — Ambulatory Visit: Payer: 59 | Admitting: Family Medicine

## 2019-09-01 ENCOUNTER — Ambulatory Visit: Payer: 59 | Admitting: Family Medicine

## 2019-09-11 ENCOUNTER — Encounter: Payer: Self-pay | Admitting: Podiatry

## 2019-09-11 ENCOUNTER — Other Ambulatory Visit: Payer: Self-pay

## 2019-09-11 ENCOUNTER — Ambulatory Visit (INDEPENDENT_AMBULATORY_CARE_PROVIDER_SITE_OTHER): Payer: Self-pay

## 2019-09-11 ENCOUNTER — Ambulatory Visit (INDEPENDENT_AMBULATORY_CARE_PROVIDER_SITE_OTHER): Payer: Self-pay | Admitting: Podiatry

## 2019-09-11 DIAGNOSIS — M2012 Hallux valgus (acquired), left foot: Secondary | ICD-10-CM

## 2019-09-11 NOTE — Progress Notes (Signed)
Subjective: Caitlin Ingram is a 38 y.o. is seen today in office s/p left foot lapidus bunionectomy and fifth digit PIPJ arthroplasty preformed on 04/12/2019.  She states that she only was able to do one physical therapy session because of insurance issues.  She states that she noted swelling she states were none currently requesting eval she tried to the exercises at home. Denies any systemic complaints such as fevers, chills, nausea, vomiting. No calf pain, chest pain, shortness of breath.   Objective: General: No acute distress, AAOx3  DP/PT pulses palpable 2/4, CRT < 3 sec to all digits.  Protective sensation intact. Motor function intact.  LEFT foot: Incision is well coapted without any evidence of dehiscence and is scar is formed. There is improved ROM of the 1st MTPJ but still restricted in dorsiflexion. Equinus is present. She is actually having more pain to the dorsal aspect of the foot more than the extensor tendons.  It is coming from equinus and lack of range of motion.  Still some swelling present.  No erythema or warmth. No other open lesions or pre-ulcerative lesions.  No pain with calf compression, swelling, warmth, erythema.   Assessment and Plan:  Status post left foot surgery  -Treatment options discussed including all alternatives, risks, and complications -X-rays reviewed.  Hardware intact.  Status post Lapidus bunionectomy as well as fifth digit arthroplasty.  Does appear to have partial consolidation across the arthrodesis site. -Pulmonary continue range of motion exercises at home and rehab.  Dispensed a night splint to help with stretching.  Compression anklet.  Also discussed compression socks.  Continue range of motion to the first 1st MTPJ at home.  She is wearing a regular shoe although mostly tied.  Hopefully she needs to improve that she can wear regular shoe -As she is having swelling and pain she cannot return to work at this time.  As the swelling improves and she  can wear her regular shoes she can return to work.  Trula Slade DPM

## 2019-09-20 ENCOUNTER — Encounter: Payer: Self-pay | Admitting: Podiatry

## 2019-09-25 ENCOUNTER — Ambulatory Visit: Payer: Self-pay | Admitting: Family Medicine

## 2019-10-03 ENCOUNTER — Encounter: Payer: Self-pay | Admitting: Podiatry

## 2019-10-03 ENCOUNTER — Telehealth: Payer: Self-pay | Admitting: Podiatry

## 2019-10-03 NOTE — Telephone Encounter (Signed)
I called the patient back. See telephone encounter.

## 2019-10-03 NOTE — Telephone Encounter (Signed)
Patient sent a MyChart message asking me to call her about her upcoming appointment for Monday. She states that she has received a new job and she starts on Monday. She is going to be working from home. She states that she cannot come in Monday if she didn't have to due to the new job. She states that she is doing much better and she is back to wearing a regular shoe and walking in a shoe without discomfort. She only gets swelling if she sits for too long with her feet down but no swelling during the day. She has no concerns. We will go ahead and cancel the appointment for Monday and she is doing to call to reschedule in the next few weeks once she has time. Encouraged to call with any questions/concerns/changes. She was thankful for the call.

## 2019-10-09 ENCOUNTER — Ambulatory Visit: Payer: Self-pay | Admitting: Podiatry

## 2019-10-23 ENCOUNTER — Encounter: Payer: Self-pay | Admitting: Podiatry

## 2020-01-04 ENCOUNTER — Ambulatory Visit (INDEPENDENT_AMBULATORY_CARE_PROVIDER_SITE_OTHER): Payer: PRIVATE HEALTH INSURANCE | Admitting: Physician Assistant

## 2020-01-04 ENCOUNTER — Encounter: Payer: Self-pay | Admitting: Physician Assistant

## 2020-01-04 VITALS — BP 138/90 | HR 80 | Temp 97.9°F | Ht 64.0 in | Wt 174.0 lb

## 2020-01-04 DIAGNOSIS — R109 Unspecified abdominal pain: Secondary | ICD-10-CM

## 2020-01-04 DIAGNOSIS — Z01818 Encounter for other preprocedural examination: Secondary | ICD-10-CM | POA: Diagnosis not present

## 2020-01-04 DIAGNOSIS — K5909 Other constipation: Secondary | ICD-10-CM | POA: Insufficient documentation

## 2020-01-04 MED ORDER — NA SULFATE-K SULFATE-MG SULF 17.5-3.13-1.6 GM/177ML PO SOLN: 1.0000 | Freq: Once | ORAL | 0 refills | Status: AC

## 2020-01-04 NOTE — Progress Notes (Signed)
Subjective:    Patient ID: Caitlin Ingram, female    DOB: 1981-11-14, 39 y.o.   MRN: [id number]  HPI Caitlin Ingram is a 39 year old African-American female, established with Dr. Hilarie Fredrickson.  She was last seen in our office in July 2020 by myself with complaints of abdominal pain which had resolved prior to her visit and constipation.  She was started on a trial of MiraLAX 17 g daily. She had undergone CT of the abdomen and pelvis prior to that visit in July 2020 which was negative and also had upper abdominal ultrasound which was negative. Patient comes in today stating that she is still dealing with the same issues.  She is frustrated and worried because she says she used to be able to take a laxative and have it work if she were constipated.  She says now nothing seems to work.  She says she took MiraLAX on a daily basis for a while but did not think that was effective.  She was then tried on a short trial of Linzess believe 145 mcg which was not helpful. She says she is able to have a bowel movement on a daily basis but only passes a very small amount of stool, or liquid and at times pellets.  She feels as if the stool is not making it to her rectum and at that she has some sort of blockage preventing her from being able to have regular bowel movements.  She has had some right-sided abdominal discomfort recently sometimes exacerbated by eating.  She has frequent episodes of gassiness which can be very uncomfortable with gas discomfort up into her shoulders and back.  She says she has to do a lot of straining on a regular basis and frequently will not be productive at all.  Review of Systems Pertinent positive and negative review of systems were noted in the above HPI section.  All other review of systems was otherwise negative.  Outpatient Encounter Medications as of 01/04/2020  Medication Sig  . LINZESS 72 MCG capsule TAKE 1 CAPSULE BY MOUTH TWICE A DAY. INSURANCE LIMIT 1 CAP DAILY  . omeprazole  (PRILOSEC) 40 MG capsule Take 1 capsule (40 mg total) by mouth daily.  . polyethylene glycol powder (GLYCOLAX/MIRALAX) 17 GM/SCOOP powder Take 17 g by mouth daily.  Marland Kitchen senna-docusate (SENOKOT-S) 8.6-50 MG tablet Take 2 tablets by mouth 2 (two) times daily.  . Na Sulfate-K Sulfate-Mg Sulf 17.5-3.13-1.6 GM/177ML SOLN Take 1 kit by mouth once for 1 dose.  . [DISCONTINUED] Bacillus Coagulans-Inulin (ALIGN PREBIOTIC-PROBIOTIC) 5-1.25 MG-GM CHEW Chew 1 capsule by mouth daily.  . [DISCONTINUED] [employer] CITRATE OF MAGNESIA SOLN DRINK 1 BOTTLE BY MOUTH ONCE AS 1 DOSE  . [DISCONTINUED] dicyclomine (BENTYL) 20 MG tablet Take 1 tablet (20 mg total) by mouth 2 (two) times daily.  . [DISCONTINUED] promethazine (PHENERGAN) 25 MG tablet Take 1 tablet (25 mg total) by mouth every 8 (eight) hours as needed for nausea or vomiting.  . [DISCONTINUED] sodium phosphate (FLEET) 7-19 GM/118ML ENEM Place 133 mLs (1 enema total) rectally daily as needed for severe constipation.  . [DISCONTINUED] Vitamin D, Ergocalciferol, (DRISDOL) 1.25 MG (50000 UT) CAPS capsule Take 50,000 Units by mouth once a week.   No facility-administered encounter medications on file as of 01/04/2020.   Allergies  Allergen Reactions  . Amoxicillin Hives  . Penicillins Hives and Itching   Patient Active Problem List   Diagnosis Date Noted  . Chronic constipation 01/04/2020  . Valgus deformity of both great  toes 01/11/2019  . Pain in right knee 03/03/2018   Social History   Socioeconomic History  . Marital status: Single    Spouse name: Not on file  . Number of children: 0  . Years of education: Not on file  . Highest education level: Not on file  Occupational History  . Not on file  Tobacco Use  . Smoking status: Former Smoker    Types: Cigars  . Smokeless tobacco: Never Used  . Tobacco comment: black and mild  Substance and Sexual Activity  . Alcohol use: Yes    Comment: occassionally  . Drug use: Yes    Types: Marijuana  . Sexual  activity: Not on file  Other Topics Concern  . Not on file  Social History Narrative  . Not on file   Social Determinants of Health   Financial Resource Strain: Low Risk   . Difficulty of Paying Living Expenses: Not hard at all  Food Insecurity: No Food Insecurity  . Worried About Charity fundraiser in the Last Year: Never true  . Ran Out of Food in the Last Year: Never true  Transportation Needs: No Transportation Needs  . Lack of Transportation (Medical): No  . Lack of Transportation (Non-Medical): No  Physical Activity: Inactive  . Days of Exercise per Week: 0 days  . Minutes of Exercise per Session: 0 min  Stress: No Stress Concern Present  . Feeling of Stress : Only a little  Social Connections: Moderately Isolated  . Frequency of Communication with Friends and Family: More than three times a week  . Frequency of Social Gatherings with Friends and Family: More than three times a week  . Attends Religious Services: Never  . Active Member of Clubs or Organizations: No  . Attends Archivist Meetings: Never  . Marital Status: Never married  Intimate Partner Violence: Not At Risk  . Fear of Current or Ex-Partner: No  . Emotionally Abused: No  . Physically Abused: No  . Sexually Abused: No    Caitlin Ingram's family history includes Diabetes in her brother; Hypertension in her father and mother.      Objective:    Vitals:   01/04/20 1044  BP: 138/90  Pulse: 80  Temp: 97.9 F (36.6 C)    Physical Exam Well-developed well-nourished AAfemale in no acute distress.  Height, NWGNFA,213 BMI29.8  HEENT; nontraumatic normocephalic, EOMI, PE R LA, sclera anicteric. Oropharynx; not examined Neck; supple, no JVD Cardiovascular; regular rate and rhythm with S1-S2, no murmur rub or gallop Pulmonary; Clear bilaterally Abdomen; soft, minimal bilateral lower quadrant tenderness, nondistended, no palpable mass or hepatosplenomegaly, bowel sounds are active Rectal; not  done today Skin; benign exam, no jaundice rash or appreciable lesions Extremities; no clubbing cyanosis or edema skin warm and dry Neuro/Psych; alert and oriented x4, grossly nonfocal mood and affect appropriate       Assessment & Plan:   #81   39 year old African-American female with chronic constipation, incomplete evacuation and complaints of gas and right-sided abdominal discomfort. Patient has not had good results with MiraLAX or Linzess 145 mcg daily with no significant improvement in ability to evacuate bowels.  Plan;Patient will be given a bowel purge today with Plenvu.  After she completes the bowel purge start Amitiza 24 mcg p.o. twice daily.  I have asked her to take this on a regular basis for a couple of weeks prior to deciding if it is helpful.  If she does not find this effective  then asked her to start MiraLAX 2-3 times daily on a regular basis. Patient will be scheduled for colonoscopy with Dr. Hilarie Fredrickson.  Procedure was discussed in detail with the patient including indications risks and benefits and she is agreeable to proceed.  Blythe Hartshorn Genia Harold PA-C 01/04/2020   Cc: Forrest Moron, MD

## 2020-01-04 NOTE — Patient Instructions (Addendum)
If you are age 40 or older, your body mass index should be between 23-30. Your Body mass index is 29.87 kg/m. If this is out of the aforementioned range listed, please consider follow up with your Primary Care Provider.  If you are age 20 or younger, your body mass index should be between 19-25. Your Body mass index is 29.87 kg/m. If this is out of the aformentioned range listed, please consider follow up with your Primary Care Provider.   We have sent the following medications to your pharmacy for you to pick up at your convenience: Amitiza 24 mcg twice daily.   Drink at least 60 oz of water daily.  Amy  recommends that you complete a bowel purge (to clean out your bowels). Please do the following: Plenvu Bowel Prep Step 1: Pour Dose 1 - Mango packet into the provided mixing container. Step 2: Add 16 oz. of cool water to the container. Step 3: Either replace lid and shake container or stir contents until completely dissolved. This may take 2-3 minutes.  Step 4: Drink entire contents of container within a 30 minute period. Step 5: Drink an additional 16 oz container of water over 30 minutes.   Wait 4 hours before starting Dose 2.   Step 1: Pour Dose 2- Packet A and B - Fruit punch flavor packet into the provided  mixing container. Step 2: Add 16 oz. Of cool water to the container. Step 3: Either replace lid and shake container or stir contents until completely dissolved. This may take 2-3 minutes.  Step 4: Drink entire contents of container within a 30 minute period. Step 5: Drink an additional 16 oz container of water over 30 minutes.

## 2020-01-10 NOTE — Progress Notes (Signed)
Addendum: Reviewed and agree with assessment and management plan. Raeden Belzer M, MD  

## 2020-01-16 ENCOUNTER — Ambulatory Visit (INDEPENDENT_AMBULATORY_CARE_PROVIDER_SITE_OTHER): Payer: PRIVATE HEALTH INSURANCE

## 2020-01-16 ENCOUNTER — Other Ambulatory Visit: Payer: Self-pay | Admitting: Internal Medicine

## 2020-01-16 DIAGNOSIS — Z1159 Encounter for screening for other viral diseases: Secondary | ICD-10-CM

## 2020-01-16 LAB — SARS CORONAVIRUS 2 (TAT 6-24 HRS): SARS Coronavirus 2: NEGATIVE

## 2020-01-18 ENCOUNTER — Other Ambulatory Visit: Payer: Self-pay

## 2020-01-18 ENCOUNTER — Encounter: Payer: Self-pay | Admitting: Internal Medicine

## 2020-01-18 ENCOUNTER — Ambulatory Visit (AMBULATORY_SURGERY_CENTER): Payer: PRIVATE HEALTH INSURANCE | Admitting: Internal Medicine

## 2020-01-18 VITALS — BP 157/99 | HR 66 | Temp 96.8°F | Resp 11 | Ht 64.0 in | Wt 174.0 lb

## 2020-01-18 DIAGNOSIS — K5909 Other constipation: Secondary | ICD-10-CM

## 2020-01-18 DIAGNOSIS — D3A8 Other benign neuroendocrine tumors: Secondary | ICD-10-CM | POA: Diagnosis not present

## 2020-01-18 DIAGNOSIS — R109 Unspecified abdominal pain: Secondary | ICD-10-CM

## 2020-01-18 DIAGNOSIS — D3A026 Benign carcinoid tumor of the rectum: Secondary | ICD-10-CM | POA: Diagnosis not present

## 2020-01-18 DIAGNOSIS — D124 Benign neoplasm of descending colon: Secondary | ICD-10-CM

## 2020-01-18 DIAGNOSIS — K635 Polyp of colon: Secondary | ICD-10-CM

## 2020-01-18 DIAGNOSIS — D128 Benign neoplasm of rectum: Secondary | ICD-10-CM

## 2020-01-18 MED ORDER — LUBIPROSTONE 24 MCG PO CAPS: 24.0000 ug | ORAL_CAPSULE | Freq: Two times a day (BID) | ORAL | 3 refills | Status: DC

## 2020-01-18 MED ORDER — SODIUM CHLORIDE 0.9 % IV SOLN: 500.0000 mL | Freq: Once | INTRAVENOUS | Status: DC

## 2020-01-18 NOTE — Progress Notes (Signed)
Pt tolerated well. VSS. Awake and to recovery. 

## 2020-01-18 NOTE — Progress Notes (Signed)
Temp JB Vitals CW 

## 2020-01-18 NOTE — Patient Instructions (Signed)
Handout on poylps given to you today  Await pathology results  Start Amitiza 31mcg twice a day with meals  NO ASPIRIN, ASPIRIN CONTAINING PRODUCTS (BC OR GOODY POWDERS) OR NSAIDS (IBUPROFEN, ADVIL, ALEVE, AND MOTRIN) FOR 2WEEKS; TYLENOL IS OK TO TAKE    YOU HAD AN ENDOSCOPIC PROCEDURE TODAY AT [facility]:   Refer to the procedure report that was given to you for any specific questions about what was found during the examination.  If the procedure report does not answer your questions, please call your gastroenterologist to clarify.  If you requested that your care partner not be given the details of your procedure findings, then the procedure report has been included in a sealed envelope for you to review at your convenience later.  YOU SHOULD EXPECT: Some feelings of bloating in the abdomen. Passage of more gas than usual.  Walking can help get rid of the air that was put into your GI tract during the procedure and reduce the bloating. If you had a lower endoscopy (such as a colonoscopy or flexible sigmoidoscopy) you may notice spotting of blood in your stool or on the toilet paper. If you underwent a bowel prep for your procedure, you may not have a normal bowel movement for a few days.  Please Note:  You might notice some irritation and congestion in your nose or some drainage.  This is from the oxygen used during your procedure.  There is no need for concern and it should clear up in a day or so.  SYMPTOMS TO REPORT IMMEDIATELY:   Following lower endoscopy (colonoscopy or flexible sigmoidoscopy):  Excessive amounts of blood in the stool  Significant tenderness or worsening of abdominal pains  Swelling of the abdomen that is new, acute  Fever of 100F or higher  For urgent or emergent issues, a gastroenterologist can be reached at any hour by calling [phone].   DIET:  We do recommend a small meal at first, but then you may proceed to your regular diet.  Drink  plenty of fluids but you should avoid alcoholic beverages for 24 hours.  ACTIVITY:  You should plan to take it easy for the rest of today and you should NOT DRIVE or use heavy machinery until tomorrow (because of the sedation medicines used during the test).    FOLLOW UP: Our staff will call the number listed on your records 48-72 hours following your procedure to check on you and address any questions or concerns that you may have regarding the information given to you following your procedure. If we do not reach you, we will leave a message.  We will attempt to reach you two times.  During this call, we will ask if you have developed any symptoms of COVID 19. If you develop any symptoms (ie: fever, flu-like symptoms, shortness of breath, cough etc.) before then, please call [phone].  If you test positive for Covid 19 in the 2 weeks post procedure, please call and report this information to [place].    If any biopsies were taken you will be contacted by phone or by letter within the next 1-3 weeks.  Please call us at [phone] if you have not heard about the biopsies in 3 weeks.    SIGNATURES/CONFIDENTIALITY: You and/or your care partner have signed paperwork which will be entered into your electronic medical record.  These signatures attest to the fact that that the information above on your After Visit Summary has been reviewed  and is understood.  Full responsibility of the confidentiality of this discharge information lies with you and/or your care-partner. 

## 2020-01-18 NOTE — Progress Notes (Signed)
Called to room to assist during endoscopic procedure.  Patient ID and intended procedure confirmed with present staff. Received instructions for my participation in the procedure from the performing physician.  

## 2020-01-18 NOTE — Op Note (Signed)
[place] Patient Name: Caitlin Ingram Procedure Date: 01/18/2020 2:27 PM MRN: [id number] Endoscopist: Jerene Bears , MD Age: 39 Referring MD:  Date of Birth: 02/07/1981 Gender: Female Account #: [id number] Procedure:                Colonoscopy Indications:              Abdominal pain in the right abdomen, chronic                            constipation and incomplete defecation Medicines:                Monitored Anesthesia Care Procedure:                Pre-Anesthesia Assessment:                           - Prior to the procedure, a History and Physical                            was performed, and patient medications and                            allergies were reviewed. The patient's tolerance of                            previous anesthesia was also reviewed. The risks                            and benefits of the procedure and the sedation                            options and risks were discussed with the patient.                            All questions were answered, and informed consent                            was obtained. Prior Anticoagulants: The patient has                            taken no previous anticoagulant or antiplatelet                            agents. ASA Grade Assessment: II - A patient with                            mild systemic disease. After reviewing the risks                            and benefits, the patient was deemed in                            satisfactory condition to undergo the procedure.  After obtaining informed consent, the colonoscope                            was passed under direct vision. Throughout the                            procedure, the patient's blood pressure, pulse, and                            oxygen saturations were monitored continuously. The                            Colonoscope was introduced through the anus and                            advanced to the terminal  ileum. The colonoscopy was                            performed without difficulty. The patient tolerated                            the procedure well. The quality of the bowel                            preparation was good. The terminal ileum, ileocecal                            valve, appendiceal orifice, and rectum were                            photographed. Scope In: 2:41:41 PM Scope Out: 3:00:38 PM Scope Withdrawal Time: 0 hours 16 minutes 16 seconds  Total Procedure Duration: 0 hours 18 minutes 57 seconds  Findings:                 The perianal and digital rectal examinations were                            normal.                           The terminal ileum appeared normal.                           A 5 mm polyp was found in the proximal descending                            colon. The polyp was sessile. The polyp was removed                            with a cold snare. Resection and retrieval were                            complete.  A 6 mm polyp was found in the rectum. The polyp was                            sessile and appeared submucosal. The polyp was                            removed with a hot snare. Resection and retrieval                            were complete.                           The exam was otherwise without abnormality on                            direct and retroflexion views. Complications:            No immediate complications. Estimated Blood Loss:     Estimated blood loss: none. Impression:               - The examined portion of the ileum was normal.                           - One 5 mm polyp in the proximal descending colon,                            removed with a cold snare. Resected and retrieved.                           - One 6 mm polyp in the rectum, removed with a hot                            snare. Resected and retrieved.                           - The examination was otherwise normal on direct                             and retroflexion views. Recommendation:           - Patient has a contact number available for                            emergencies. The signs and symptoms of potential                            delayed complications were discussed with the                            patient. Return to normal activities tomorrow.                            Written discharge instructions were provided to the  patient.                           - Resume previous diet.                           - Continue present medications.                           - Begin Amitiza 24 mcg twice daily (best taken with                            food). Office follow-up is recommended if this                            medication is not effective or if constipation                            symptoms persist.                           - No aspirin, ibuprofen, naproxen, or other                            non-steroidal anti-inflammatory drugs for 2 weeks                            after polyp removal.                           - Await pathology results.                           - Repeat colonoscopy is recommended. The                            colonoscopy date will be determined after pathology                            results from today's exam become available for                            review. Jerene Bears, MD 01/18/2020 3:05:47 PM This report has been signed electronically.

## 2020-01-19 IMAGING — CT CT ABDOMEN AND PELVIS WITH CONTRAST
2 of 4 series · 16 of 46 positions shown, 18 images · IV contrast (APPLIED)
Comparison: 05/22/2019 unenhanced CT abdomen/pelvis.

CLINICAL DATA: Abdominal pain, most prominent in the left lower
quadrant. Constipation.

EXAM:
CT ABDOMEN AND PELVIS WITH CONTRAST
TECHNIQUE: Multidetector CT imaging of the abdomen and pelvis was performed
using the standard protocol following bolus administration of
intravenous contrast.
CONTRAST:  100mL OMNIPAQUE IOHEXOL 300 MG/ML  SOLN

[Series 3: abd/ pelvis 5.0 i30f 2 · axial · 0.73mm/px · z∈[+729,+1149]mm · 13 of 94 slices shown, 15 images]
[im 5/94  soft-tissue]
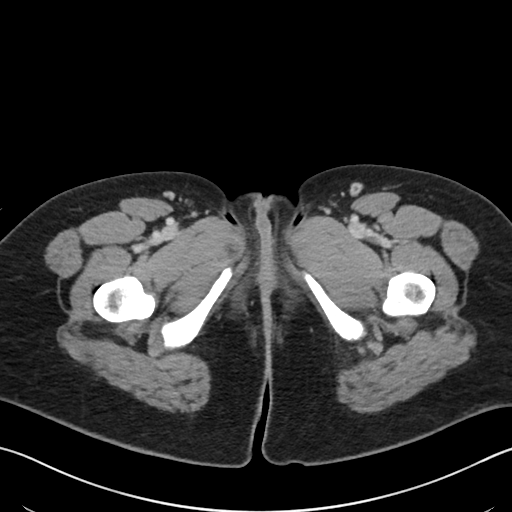
[im 5/94  bone]
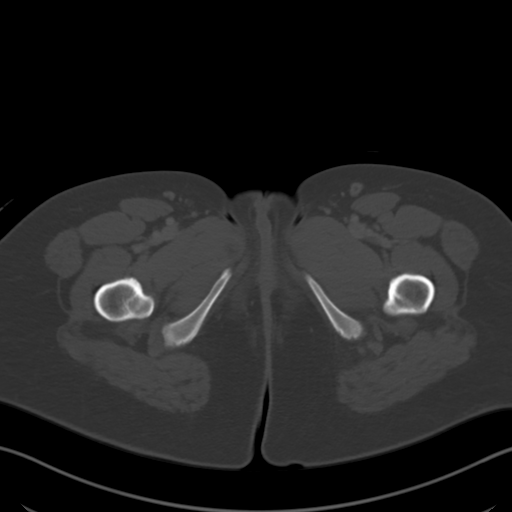
[im 13/94  soft-tissue]
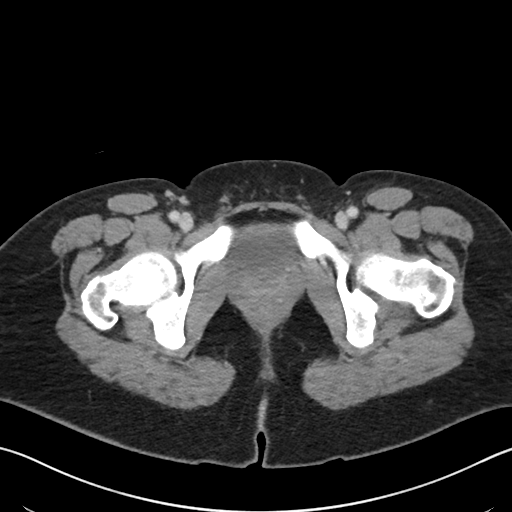
[im 21/94  soft-tissue]
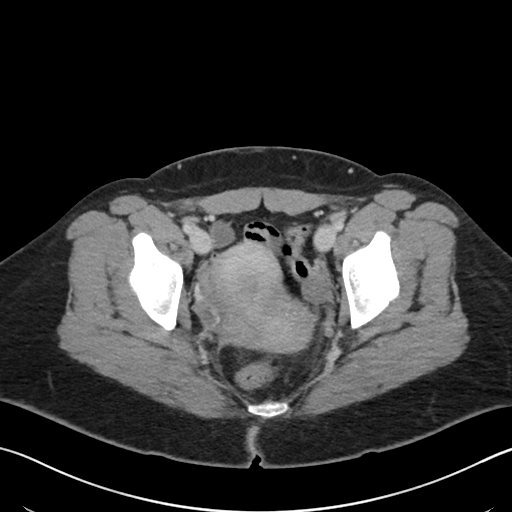
[im 25/94  soft-tissue]
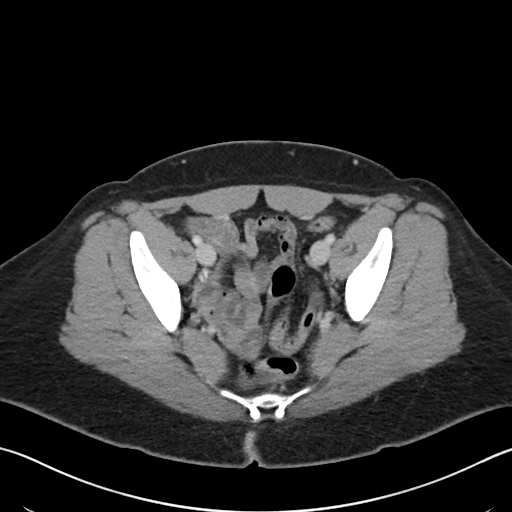
[im 33/94  soft-tissue]
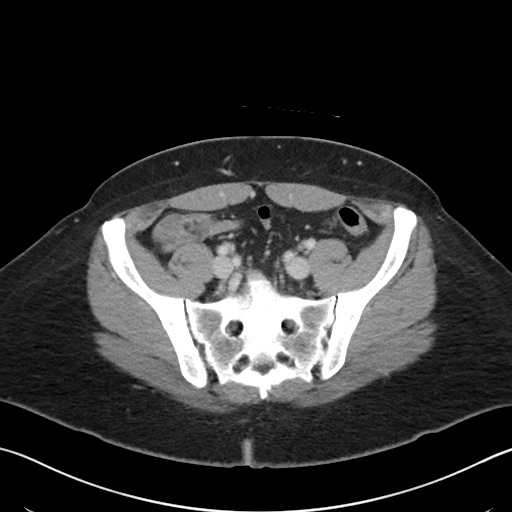
[im 41/94  soft-tissue]
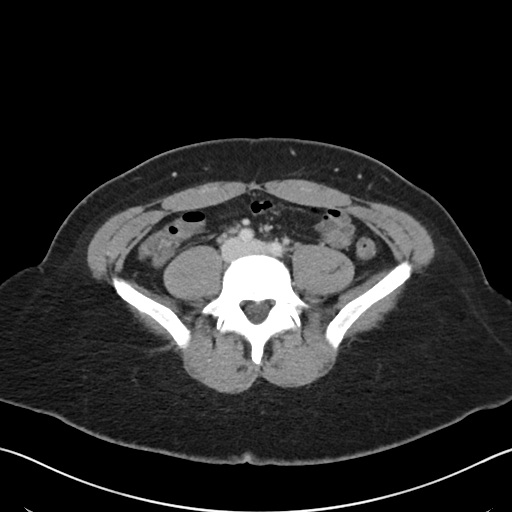
[im 49/94  soft-tissue]
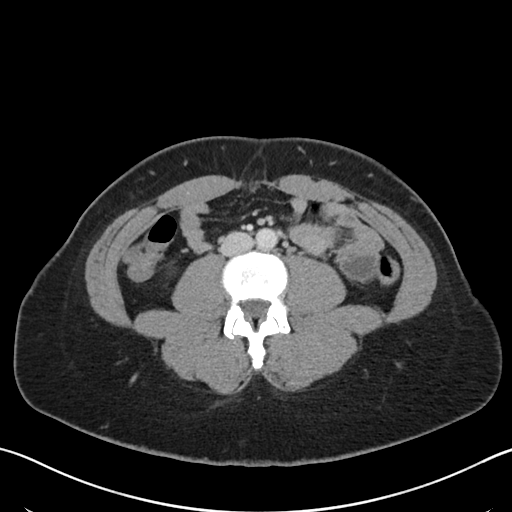
[im 53/94  soft-tissue]
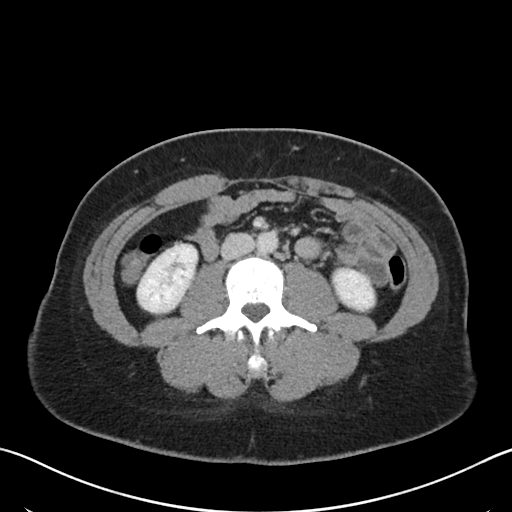
[im 61/94  soft-tissue]
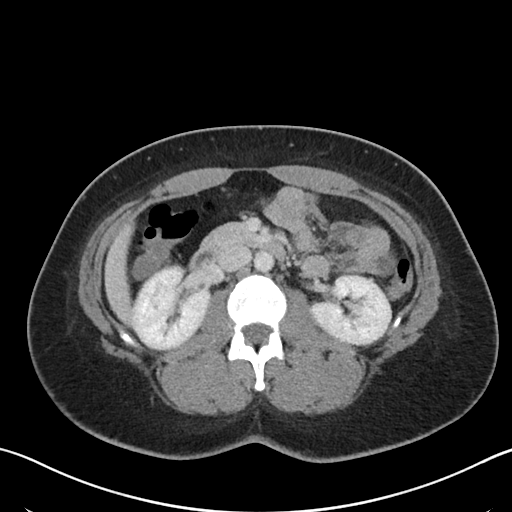
[im 61/94  bone]
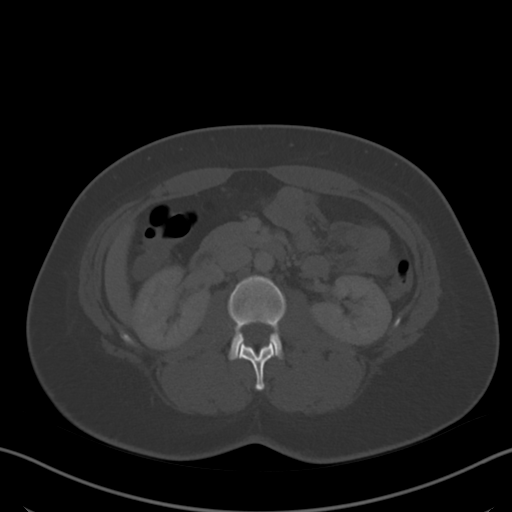
[im 69/94  soft-tissue]
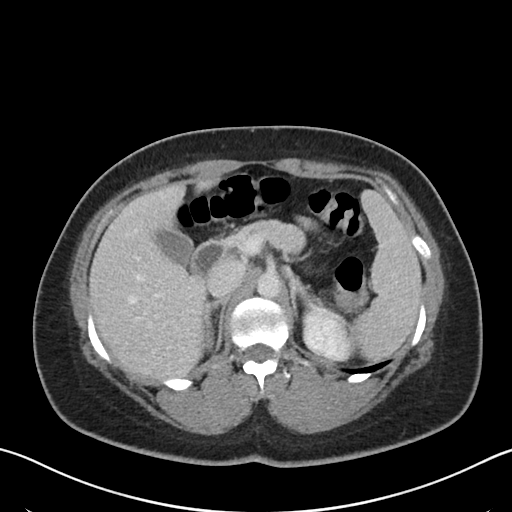
[im 73/94  soft-tissue]
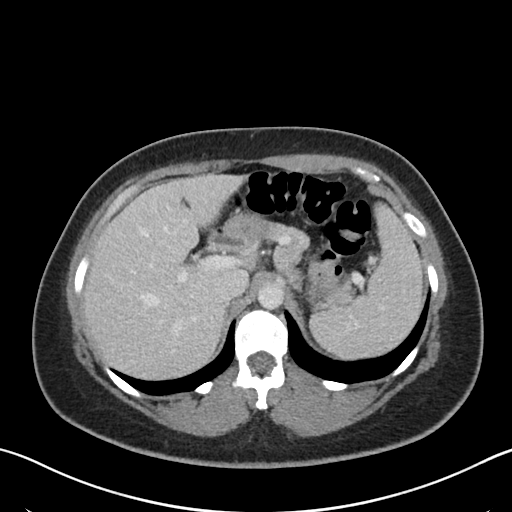
[im 81/94  soft-tissue]
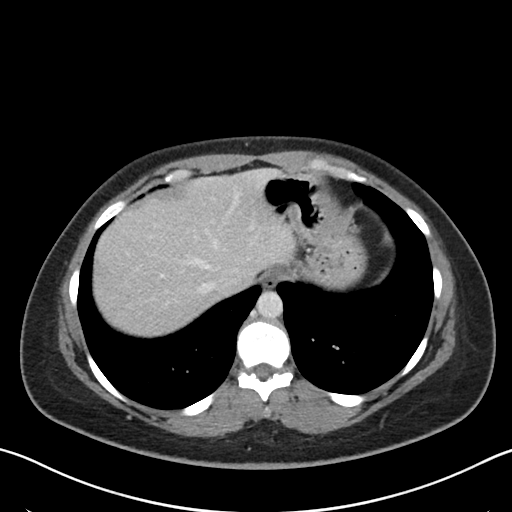
[im 89/94  soft-tissue]
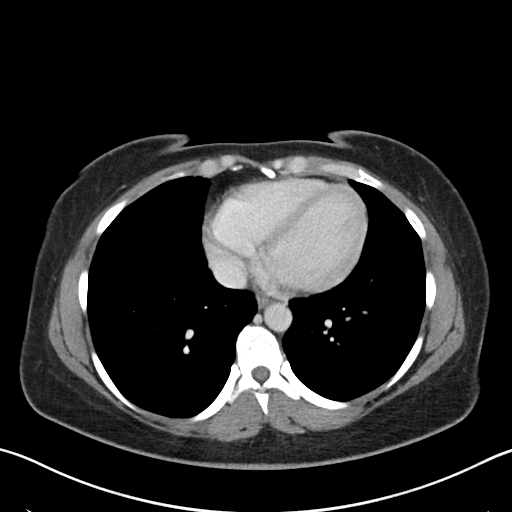

[Series 6: coronal soft tissue · coronal · 0.76mm/px · 3 of 101 slices shown]
[im 34/101  soft-tissue]
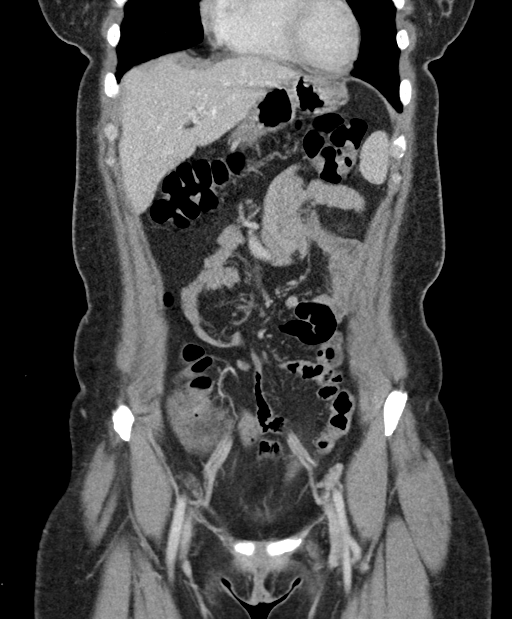
[im 45/101  soft-tissue]
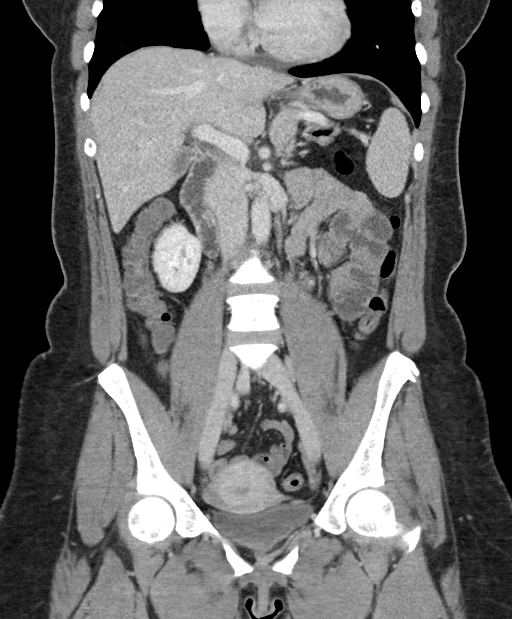
[im 56/101  soft-tissue]
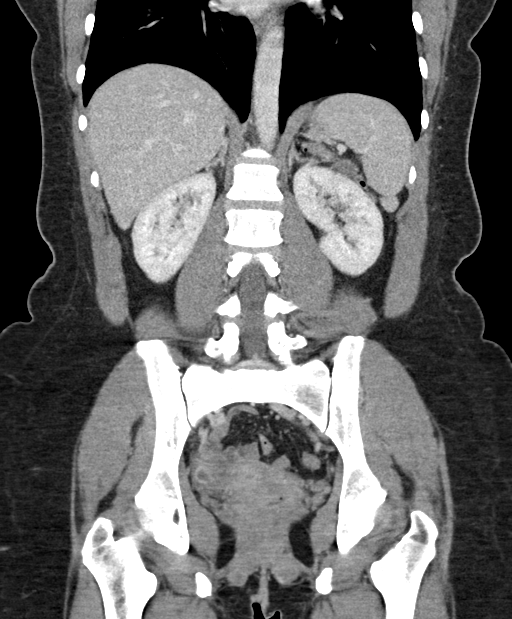

[16 of 46 positions shown; findings below may reference images not displayed]

FINDINGS: Lower chest: No significant pulmonary nodules or acute consolidative
airspace disease.

Hepatobiliary: Normal liver size. No liver mass. Normal gallbladder
with no radiopaque cholelithiasis. No biliary ductal dilatation.

Pancreas: Normal, with no mass or duct dilation.

Spleen: Normal size. No mass.

Adrenals/Urinary Tract: Normal adrenals. No hydronephrosis.
Subcentimeter hypodense interpolar left renal cortical lesion is too
small to characterize and requires no follow-up. Kidneys are normal
size and demonstrate symmetric normal contrast nephrograms. Normal
bladder.

Stomach/Bowel: Normal non-distended stomach. Normal caliber small
bowel with no small bowel wall thickening. Normal appendix. Normal
large bowel with no diverticulosis, large bowel wall thickening or
pericolonic fat stranding. No significant colonic stool.

Vascular/Lymphatic: Normal caliber abdominal aorta. Patent portal,
splenic, hepatic and renal veins. No pathologically enlarged lymph
nodes in the abdomen or pelvis.

Reproductive: Grossly normal uterus. No adnexal mass. Right ovarian
1.9 cm corpus luteum.

Other: No pneumoperitoneum, ascites or focal fluid collection. Small
fat containing umbilical hernia is stable.

Musculoskeletal: No aggressive appearing focal osseous lesions.
IMPRESSION: No acute abnormality. No evidence of bowel obstruction or acute
bowel inflammation. Normal appendix. No significant colonic stool to
suggest constipation.

## 2020-01-22 ENCOUNTER — Telehealth: Payer: Self-pay

## 2020-01-22 ENCOUNTER — Telehealth: Payer: Self-pay | Admitting: *Deleted

## 2020-01-22 NOTE — Telephone Encounter (Signed)
Second Follow up call made, no answer, left message. 

## 2020-01-22 NOTE — Telephone Encounter (Signed)
Left message on follow up call. 

## 2020-08-24 ENCOUNTER — Encounter: Payer: Self-pay | Admitting: Podiatry

## 2020-08-26 NOTE — Telephone Encounter (Signed)
I called the patient to go over her concerns. She feels that over the last few weeks she has had some pain to the foot. She feels like she felt a pop and now the foot is swollen. The pain increased last week. She has been wearing the CAM boot.   She states that she had some financial issues and not sure if she can come in. I offered her a no charge visit to evaluate. She is doing to look at the schedule and she will try to get in this week. She is going to message me a time she can come in.

## 2021-01-22 ENCOUNTER — Ambulatory Visit: Payer: Self-pay | Admitting: Nurse Practitioner

## 2021-01-28 ENCOUNTER — Encounter: Payer: Self-pay | Admitting: Nurse Practitioner

## 2021-01-28 ENCOUNTER — Ambulatory Visit (INDEPENDENT_AMBULATORY_CARE_PROVIDER_SITE_OTHER): Payer: Managed Care, Other (non HMO) | Admitting: Nurse Practitioner

## 2021-01-28 ENCOUNTER — Other Ambulatory Visit: Payer: Self-pay

## 2021-01-28 VITALS — BP 122/80 | HR 74 | Temp 98.9°F | Ht 65.4 in | Wt 194.4 lb

## 2021-01-28 DIAGNOSIS — Z7689 Persons encountering health services in other specified circumstances: Secondary | ICD-10-CM

## 2021-01-28 DIAGNOSIS — K5909 Other constipation: Secondary | ICD-10-CM

## 2021-01-28 DIAGNOSIS — Z1159 Encounter for screening for other viral diseases: Secondary | ICD-10-CM

## 2021-01-28 MED ORDER — LUBIPROSTONE 24 MCG PO CAPS: 24.0000 ug | ORAL_CAPSULE | Freq: Two times a day (BID) | ORAL | 2 refills | Status: DC

## 2021-01-28 NOTE — Patient Instructions (Signed)
Consider changing your diet to limit gluten and heavy foods. Increase water intake.  Take the probiotic daily  Give the amitiza a fair try taking two times a day.

## 2021-01-28 NOTE — Progress Notes (Signed)
I,Yamilka Roman [employer] as a Education administrator for [employer], FNP.,have documented all relevant documentation on the behalf of Minette Brine, FNP,as directed by  Minette Brine, FNP while in the presence of Minette Brine, [place]. This visit occurred during the SARS-CoV-2 public health emergency.  Safety protocols were in place, including screening questions prior to the visit, additional usage of staff PPE, and extensive cleaning of exam room while observing appropriate contact time as indicated for disinfecting solutions.  Subjective:     Patient ID: Caitlin Ingram , female    DOB: 08/02/1981 , 40 y.o.   MRN: [id number]   Chief Complaint  Patient presents with  . Establish Care  . Abdominal Pain    Patient stated she has been having some gas built up in her stomach. She stated she is not constipated. She notices the gas build up all the time.    HPI  Patient here to establish primary care. She stated she would like to be evaluated for some stomach concerns she has been having.  She found [place] online. She is looking for a provider interested in her health.  She has been to [facility], clinic beside hospital and [facility]. 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   PMH - kidney stones (couple times), left foot operation. Cyst on throat at age 35.   [facility] - mother  - hypertension, diabetes. Father - hypertension, diabetes. Older brother - diabetes. Brother - healthy.    She has been seen by Dr. Hilarie Fredrickson (GI) for her colonoscopy - found one polyp will do another colonoscopy in one year.  In 2020, she would notice problems when she took laxatives - epsom salt. Stopped going the normal. After her foot operation she began having more gas build up. Thought to be was constipation.  Continued having pain to her shoulder and back, discomfort near the gallbladder. Had xrays and ultrasound, breath test and CT scan everything was normal. Will sometimes be more water than stool.  She has had a full  work up and was normal. She has problems with constipation.  Uncomfortable with sleeping at night except for on the right side. She had been avoiding red meats and breads.  She would feel heavy in her stomach.  Her water intake is not the best? She will drink about 4 glasses of water a day. 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 She has taken linzess and amitiza.   She does have a GYN at [place].     Past Medical History:  Diagnosis Date  . Kidney stones   . Renal disorder    renal calculi     Family History  Problem Relation Age of Onset  . Hypertension Mother   . Hypertension Father   . Diabetes Brother      Current Outpatient Medications:  .  lubiprostone (AMITIZA) 24 MCG capsule, Take 1 capsule (24 mcg total) by mouth 2 (two) times daily with a meal., Disp: 60 capsule, Rfl: 2   Allergies  Allergen Reactions  . Amoxicillin Hives  . Penicillins Hives and Itching     Review of Systems  Constitutional: Negative.   HENT: Negative.   Eyes: Negative.   Respiratory: Negative.  Negative for cough.   Cardiovascular: Negative.  Negative for chest pain, palpitations and leg swelling.  Gastrointestinal: Negative.   Endocrine: Negative.   Genitourinary: Negative.   Musculoskeletal: Negative.   Skin: Negative.   Neurological:  Negative.  Negative for dizziness and headaches.  Hematological: Negative.   Psychiatric/Behavioral: Negative.      Today's Vitals   01/28/21 1543  BP: 122/80  Pulse: 74  Temp: 98.9 F (37.2 C)  TempSrc: Oral  Weight: 194 lb 6.4 oz (88.2 kg)  Height: 5' 5.4" (1.661 m)  PainSc: 0-No pain   Body mass index is 31.96 kg/m.   Objective:  Physical Exam Constitutional:      General: She is not in acute distress.    Appearance: She is well-developed. She is obese.  Cardiovascular:     Rate and Rhythm: Normal rate and regular rhythm.     Heart sounds: Normal heart sounds.   Pulmonary:     Effort: Pulmonary effort is normal.     Breath sounds: Normal breath sounds.  Abdominal:     General: Abdomen is flat. Bowel sounds are normal.     Palpations: Abdomen is soft.  Genitourinary:    Adnexa: Right adnexa normal.  Skin:    General: Skin is warm and dry.     Capillary Refill: Capillary refill takes less than 2 seconds.  Neurological:     General: No focal deficit present.     Mental Status: She is alert and oriented to person, place, and time.  Psychiatric:        Mood and Affect: Mood normal.        Behavior: Behavior normal.         Assessment And Plan:     1. Chronic constipation  Will try her on amitiza, encouraged to stay well hydrated with water  She is to increase her fiber intake as well.  - lubiprostone (AMITIZA) 24 MCG capsule; Take 1 capsule (24 mcg total) by mouth 2 (two) times daily with a meal.  Dispense: 60 capsule; Refill: 2 - CBC  2. Encounter to establish care with new doctor - Lipid panel  3. Encounter for hepatitis C screening test for low risk patient  Will check Hepatitis C screening due to recent recommendations to screen all adults 18 years and older - Hepatitis C antibody - CMP14+EGFR       Patient was given opportunity to ask questions. Patient verbalized understanding of the plan and was able to repeat key elements of the plan. All questions were answered to their satisfaction.  Minette Brine, FNP   I, Minette Brine, FNP, have reviewed all documentation for this visit. The documentation on 02/17/21 for the exam, diagnosis, procedures, and orders are all accurate and complete.  THE PATIENT IS ENCOURAGED TO PRACTICE SOCIAL DISTANCING DUE TO THE COVID-19 PANDEMIC.

## 2021-01-29 LAB — CMP14+EGFR
ALT: 9 IU/L (ref 0–32)
AST: 15 IU/L (ref 0–40)
Albumin/Globulin Ratio: 1.7 (ref 1.2–2.2)
Albumin: 4.3 g/dL (ref 3.8–4.8)
Alkaline Phosphatase: 96 IU/L (ref 44–121)
BUN/Creatinine Ratio: 22 (ref 9–23)
BUN: 19 mg/dL (ref 6–24)
Bilirubin Total: <0.2 mg/dL (ref 0.0–1.2)
CO2: 23 mmol/L (ref 20–29)
Calcium: 9.2 mg/dL (ref 8.7–10.2)
Chloride: 105 mmol/L (ref 96–106)
Creatinine, Ser: 0.86 mg/dL (ref 0.57–1.00)
Globulin, Total: 2.5 g/dL (ref 1.5–4.5)
Glucose: 86 mg/dL (ref 65–99)
Potassium: 4.5 mmol/L (ref 3.5–5.2)
Sodium: 143 mmol/L (ref 134–144)
Total Protein: 6.8 g/dL (ref 6.0–8.5)
eGFR: 88 mL/min/{1.73_m2} (ref >59)

## 2021-01-29 LAB — LIPID PANEL
Chol/HDL Ratio: 3.8 ratio (ref 0.0–4.4)
Cholesterol, Total: 146 mg/dL (ref 100–199)
HDL: 38 mg/dL — ABNORMAL LOW (ref >39)
LDL Chol Calc (NIH): 95 mg/dL (ref 0–99)
Triglycerides: 64 mg/dL (ref 0–149)
VLDL Cholesterol Cal: 13 mg/dL (ref 5–40)

## 2021-01-29 LAB — CBC
Hematocrit: 42 % (ref 34.0–46.6)
Hemoglobin: 14 g/dL (ref 11.1–15.9)
MCH: 29.4 pg (ref 26.6–33.0)
MCHC: 33.3 g/dL (ref 31.5–35.7)
MCV: 88 fL (ref 79–97)
Platelets: 339 10*3/uL (ref 150–450)
RBC: 4.77 x10E6/uL (ref 3.77–5.28)
RDW: 13.5 % (ref 11.7–15.4)
WBC: 9 10*3/uL (ref 3.4–10.8)

## 2021-01-29 LAB — HEPATITIS C ANTIBODY: Hep C Virus Ab: <0.1 s/co ratio (ref 0.0–0.9)

## 2021-02-25 ENCOUNTER — Ambulatory Visit: Payer: Managed Care, Other (non HMO) | Admitting: Nurse Practitioner

## 2021-02-25 ENCOUNTER — Telehealth: Payer: Self-pay | Admitting: Nurse Practitioner

## 2021-02-25 ENCOUNTER — Telehealth: Payer: Self-pay

## 2021-02-25 NOTE — Telephone Encounter (Signed)
Patient no showed appt. Called to reschedule left VM

## 2021-02-25 NOTE — Telephone Encounter (Signed)
Patient missed her appt this morning I left her a vm to call the office so we can reschedule YL,RMA

## 2021-03-03 ENCOUNTER — Other Ambulatory Visit: Payer: Self-pay

## 2021-03-03 ENCOUNTER — Emergency Department (HOSPITAL_COMMUNITY)
Admission: EM | Admit: 2021-03-03 | Discharge: 2021-03-04 | Discharge status: Against Medical Advice | Payer: 59 | Attending: Emergency Medicine | Admitting: Emergency Medicine

## 2021-03-03 DIAGNOSIS — Z5321 Procedure and treatment not carried out due to patient leaving prior to being seen by health care provider: Secondary | ICD-10-CM | POA: Insufficient documentation

## 2021-03-03 DIAGNOSIS — R109 Unspecified abdominal pain: Secondary | ICD-10-CM | POA: Insufficient documentation

## 2021-03-03 LAB — COMPREHENSIVE METABOLIC PANEL WITH GFR
ALT: 11 U/L (ref 0–44)
AST: 15 U/L (ref 15–41)
Albumin: 3.6 g/dL (ref 3.5–5.0)
Alkaline Phosphatase: 73 U/L (ref 38–126)
Anion gap: 4 — ABNORMAL LOW (ref 5–15)
BUN: 16 mg/dL (ref 6–20)
CO2: 26 mmol/L (ref 22–32)
Calcium: 8.6 mg/dL — ABNORMAL LOW (ref 8.9–10.3)
Chloride: 106 mmol/L (ref 98–111)
Creatinine, Ser: 0.78 mg/dL (ref 0.44–1.00)
GFR, Estimated: >60 mL/min
Glucose, Bld: 92 mg/dL (ref 70–99)
Potassium: 4 mmol/L (ref 3.5–5.1)
Sodium: 136 mmol/L (ref 135–145)
Total Bilirubin: 0.5 mg/dL (ref 0.3–1.2)
Total Protein: 6.3 g/dL — ABNORMAL LOW (ref 6.5–8.1)

## 2021-03-03 LAB — CBC WITH DIFFERENTIAL/PLATELET
Abs Immature Granulocytes: 0.03 10*3/uL (ref 0.00–0.07)
Basophils Absolute: 0.1 10*3/uL (ref 0.0–0.1)
Basophils Relative: 1 %
Eosinophils Absolute: 0.1 10*3/uL (ref 0.0–0.5)
Eosinophils Relative: 2 %
HCT: 39.8 % (ref 36.0–46.0)
Hemoglobin: 12.6 g/dL (ref 12.0–15.0)
Immature Granulocytes: 0 %
Lymphocytes Relative: 41 %
Lymphs Abs: 3.1 10*3/uL (ref 0.7–4.0)
MCH: 29.3 pg (ref 26.0–34.0)
MCHC: 31.7 g/dL (ref 30.0–36.0)
MCV: 92.6 fL (ref 80.0–100.0)
Monocytes Absolute: 0.5 10*3/uL (ref 0.1–1.0)
Monocytes Relative: 6 %
Neutro Abs: 3.8 10*3/uL (ref 1.7–7.7)
Neutrophils Relative %: 50 %
Platelets: 245 10*3/uL (ref 150–400)
RBC: 4.3 MIL/uL (ref 3.87–5.11)
RDW: 14.8 % (ref 11.5–15.5)
WBC: 7.5 10*3/uL (ref 4.0–10.5)
nRBC: 0 % (ref 0.0–0.2)

## 2021-03-03 LAB — LIPASE, BLOOD: Lipase: 24 U/L (ref 11–51)

## 2021-03-03 NOTE — ED Triage Notes (Signed)
Emergency Medicine Provider Triage Evaluation Note  Caitlin Ingram , a 40 y.o. female  was evaluated in triage.  Pt complains of abdominal pain.  States has been ongoing x2 years.  States she has been seen times for this previously.  States she has been told previously this was constipation however she feels that this is constipation.  Last bowel movement was 5 days ago.  She took a laxative 2 days ago.  She feels like she has pain to her periumbilical region more so on the right side.  Pain is not associated with food intake.  No right upper or right lower abdominal tenderness.  States she was seen by GI approximately 1 year ago had a colonoscopy which not show any significant abnormality.  She feels like she needs to burp however cannot.  She denies any fever, chills, chest pain, shortness of breath, emesis  Review of Systems  Positive: Abdominal pain  negative: Chest pain, shortness of breath, emesis, diarrhea, dysuria  Physical Exam  BP (!) 176/112   Pulse 66   Resp 18   SpO2 99%  Gen:   Awake, no distress   HEENT:  Atraumatic  Resp:  Normal effort  Cardiac:  Normal rate  Abd:   Nondistended, nontender MSK:   Moves extremities without difficulty  Neuro:  Speech clear   Medical Decision Making  Medically screening exam initiated at 5:15 PM.  Appropriate orders placed.  Mckay Brandt Koranda was informed that the remainder of the evaluation will be completed by another provider, this initial triage assessment does not replace that evaluation, and the importance of remaining in the ED until their evaluation is complete.  Clinical Impression  Abdominal pain   Florentino Laabs A, PA-C 03/03/21 1716

## 2021-03-03 NOTE — ED Triage Notes (Signed)
C/O ongoing abdominal pain

## 2021-03-05 ENCOUNTER — Telehealth: Payer: Self-pay

## 2021-03-05 NOTE — Telephone Encounter (Signed)
I called pt and left her a vm to call the office we seen she went to the ER but did not stay so I was calling to see If she needed an appointment [facility]

## 2021-04-11 ENCOUNTER — Encounter: Payer: Self-pay | Admitting: *Deleted

## 2021-04-17 ENCOUNTER — Ambulatory Visit (INDEPENDENT_AMBULATORY_CARE_PROVIDER_SITE_OTHER): Payer: 59 | Admitting: Internal Medicine

## 2021-04-17 ENCOUNTER — Other Ambulatory Visit (INDEPENDENT_AMBULATORY_CARE_PROVIDER_SITE_OTHER): Payer: 59

## 2021-04-17 ENCOUNTER — Encounter: Payer: Self-pay | Admitting: Internal Medicine

## 2021-04-17 ENCOUNTER — Other Ambulatory Visit: Payer: Self-pay

## 2021-04-17 VITALS — BP 152/100 | HR 74 | Ht 65.4 in | Wt 189.5 lb

## 2021-04-17 DIAGNOSIS — R6889 Other general symptoms and signs: Secondary | ICD-10-CM

## 2021-04-17 DIAGNOSIS — K5900 Constipation, unspecified: Secondary | ICD-10-CM

## 2021-04-17 DIAGNOSIS — Z86012 Personal history of benign carcinoid tumor: Secondary | ICD-10-CM | POA: Diagnosis not present

## 2021-04-17 DIAGNOSIS — R150 Incomplete defecation: Secondary | ICD-10-CM

## 2021-04-17 DIAGNOSIS — R140 Abdominal distension (gaseous): Secondary | ICD-10-CM

## 2021-04-17 LAB — TSH: TSH: 1.5 u[IU]/mL (ref 0.35–4.50)

## 2021-04-17 MED ORDER — MOTEGRITY 2 MG PO TABS: 1.0000 | ORAL_TABLET | Freq: Every day | ORAL | 0 refills | Status: DC

## 2021-04-17 NOTE — Progress Notes (Signed)
Subjective:    Patient ID: Caitlin Ingram, female    DOB: 02-04-1981, 40 y.o.   MRN: [id number]  HPI Caitlin Ingram is a 40 year old female with a history of rectal NET, chronic constipation who is seen for follow-up.  She is here alone today.  She had a colonoscopy 1 year ago to evaluate right-sided abdominal pain, chronic constipation and incomplete defecation.  This revealed normal terminal ileum.  A 5 mm descending colon polyp which was hyperplastic and a 6 mm rectal polyp which appeared submucosal and was removed by hot snare.  This showed a well differentiated neuroendocrine tumor.  The margins were uninvolved by tumor.  She had been on Amitiza 24 mcg twice daily that she is now off of this and using senna plus Colace twice daily.  She continues to have issues with incomplete defecation abdominal bloating and discomfort.  She reports her bowel movements at times while soft only come out as "small droplets".  This leads to an uncomfortable feeling and lower abdominal pressure sensation.  She reports feeling like her rectum will not let everything out that needs to come out.  At times she will have lower abdominal and lower back pressure.  She has not seen blood in her stool or melena.  She does have a sensation of "trapped air" throughout the abdomen which makes her uncomfortable even in her upper abdomen.  She is up-to-date with GYN and reports that no issues have been found during her routine exams.  She does not have a history of endometriosis.  Her constipation symptoms are definitively worse around her menstrual cycle and for 1 to 2 weeks thereafter.  She reports 2 weeks before her cycle she will start to have a better "flow" with her bowel movements.   Review of Systems As per HPI, otherwise negative  Current Medications, Allergies, Past Medical History, Past Surgical History, Family History and Social History were reviewed in [employer] record.      Objective:   Physical Exam BP (!) 152/100 (BP Location: Left Arm, Patient Position: Sitting, Cuff Size: Normal)   Pulse 74   Ht 5' 5.4" (1.661 m)   Wt 189 lb 8 oz (86 kg)   SpO2 99%   BMI 31.15 kg/m  Gen: awake, alert, NAD HEENT: anicteric  CV: RRR, no mrg Pulm: CTA b/l Abd: soft, NT/ND, +BS throughout Ext: no c/c/e Neuro: nonfocal  ULTRASOUND ABDOMEN LIMITED RIGHT UPPER QUADRANT   COMPARISON:  CT abdomen and pelvis May 26, 2019.   FINDINGS: Gallbladder:   No gallstones or wall thickening visualized. There is no pericholecystic fluid. No sonographic Murphy sign noted by sonographer.   Common bile duct:   Diameter: 3 mm. No intrahepatic or extrahepatic biliary duct dilatation.   Liver:   No focal lesion identified. Within normal limits in parenchymal echogenicity. Portal vein is patent on color Doppler imaging with normal direction of blood flow towards the liver.   IMPRESSION: Study within normal limits.     Electronically Signed   By: Lowella Grip III M.D.   On: 06/01/2019 10:29   CT ABDOMEN AND PELVIS WITH CONTRAST   TECHNIQUE: Multidetector CT imaging of the abdomen and pelvis was performed using the standard protocol following bolus administration of intravenous contrast.   CONTRAST:  118mL OMNIPAQUE IOHEXOL 300 MG/ML  SOLN   COMPARISON:  05/22/2019 unenhanced CT abdomen/pelvis.   FINDINGS: Lower chest: No significant pulmonary nodules or acute consolidative airspace disease.   Hepatobiliary: Normal liver  size. No liver mass. Normal gallbladder with no radiopaque cholelithiasis. No biliary ductal dilatation.   Pancreas: Normal, with no mass or duct dilation.   Spleen: Normal size. No mass.   Adrenals/Urinary Tract: Normal adrenals. No hydronephrosis. Subcentimeter hypodense interpolar left renal cortical lesion is too small to characterize and requires no follow-up. Kidneys are normal size and demonstrate symmetric normal contrast  nephrograms. Normal bladder.   Stomach/Bowel: Normal non-distended stomach. Normal caliber small bowel with no small bowel wall thickening. Normal appendix. Normal large bowel with no diverticulosis, large bowel wall thickening or pericolonic fat stranding. No significant colonic stool.   Vascular/Lymphatic: Normal caliber abdominal aorta. Patent portal, splenic, hepatic and renal veins. No pathologically enlarged lymph nodes in the abdomen or pelvis.   Reproductive: Grossly normal uterus. No adnexal mass. Right ovarian 1.9 cm corpus luteum.   Other: No pneumoperitoneum, ascites or focal fluid collection. Small fat containing umbilical hernia is stable.   Musculoskeletal: No aggressive appearing focal osseous lesions.   IMPRESSION: No acute abnormality. No evidence of bowel obstruction or acute bowel inflammation. Normal appendix. No significant colonic stool to suggest constipation.     Electronically Signed   By: Ilona Sorrel M.D.   On: 05/26/2019 17:25   CBC    Component Value Date/Time   WBC 7.5 03/03/2021 1715   RBC 4.30 03/03/2021 1715   HGB 12.6 03/03/2021 1715   HGB 14.0 01/28/2021 1707   HCT 39.8 03/03/2021 1715   HCT 42.0 01/28/2021 1707   PLT 245 03/03/2021 1715   PLT 339 01/28/2021 1707   MCV 92.6 03/03/2021 1715   MCV 88 01/28/2021 1707   MCH 29.3 03/03/2021 1715   MCHC 31.7 03/03/2021 1715   RDW 14.8 03/03/2021 1715   RDW 13.5 01/28/2021 1707   LYMPHSABS 3.1 03/03/2021 1715   MONOABS 0.5 03/03/2021 1715   EOSABS 0.1 03/03/2021 1715   BASOSABS 0.1 03/03/2021 1715   CMP     Component Value Date/Time   NA 136 03/03/2021 1715   NA 143 01/28/2021 1707   K 4.0 03/03/2021 1715   CL 106 03/03/2021 1715   CO2 26 03/03/2021 1715   GLUCOSE 92 03/03/2021 1715   BUN 16 03/03/2021 1715   BUN 19 01/28/2021 1707   CREATININE 0.78 03/03/2021 1715   CALCIUM 8.6 (L) 03/03/2021 1715   PROT 6.3 (L) 03/03/2021 1715   PROT 6.8 01/28/2021 1707   ALBUMIN 3.6  03/03/2021 1715   ALBUMIN 4.3 01/28/2021 1707   AST 15 03/03/2021 1715   ALT 11 03/03/2021 1715   ALKPHOS 73 03/03/2021 1715   BILITOT 0.5 03/03/2021 1715   BILITOT <0.2 01/28/2021 1707   GFRNONAA >60 03/03/2021 1715   GFRAA 102 06/21/2019 1034       Assessment & Plan:  41 year old female with a history of rectal NET, chronic constipation who is seen for follow-up.  1.  Chronic constipation/abdominal bloating/incomplete defecation --we discussed her symptoms today which do overlap with chronic constipation that may also in part be secondary to dyssynergic defecation or pelvic floor dysfunction.  We discussed normal anorectal function and this may be causing some of her issues.  Amitiza did not work consistently well for her.  I have recommended the following: --Trial of Motegrity 2 mg daily -- Flexible sigmoidoscopy, see #2 -- If Motegrity does not improve symptoms completely then I would recommend anorectal manometry to evaluate for pelvic floor dysfunction/dyssynergic defecation -- Another possibility would be SIBO which we could consider testing for depending on  how she responds to the above  2.  History of well differentiated NET of the rectum --it is very likely that this was removed entirely by hot snare polypectomy 1 year ago.  I recommended a flexible sigmoidoscopy to ensure no residual neuroendocrine tumor in the rectum.  This will also serve to exclude anorectal pathology which is felt unlikely given her recent colonoscopy -- Flexible sigmoidoscopy  3.  Cold intolerance --she reports feeling cold all the time, I checked TSH which was normal.  30 minutes total spent today including patient facing time, coordination of care, reviewing medical history/procedures/pertinent radiology studies, and documentation of the encounter.

## 2021-04-17 NOTE — Patient Instructions (Addendum)
Your provider has requested that you go to the basement level for lab work before leaving today. Press "B" on the elevator. The lab is located at the first door on the left as you exit the elevator.  You have been scheduled for a flexible sigmoidoscopy. Please follow the written instructions given to you at your visit today. If you use inhalers (even only as needed), please bring them with you on the day of your procedure.  We have sent the following medications to your pharmacy for you to pick up at your convenience: Motegrity   You have been scheduled for a follow up with Dr Hilarie Fredrickson on 07/22/21 at 41 pm.  If you are age 41 or older, your body mass index should be between 23-30. Your Body mass index is 31.15 kg/m. If this is out of the aforementioned range listed, please consider follow up with your Primary Care Provider.  If you are age 41 or younger, your body mass index should be between 19-25. Your Body mass index is 31.15 kg/m. If this is out of the aformentioned range listed, please consider follow up with your Primary Care Provider.   __________________________________________________________  The [facility] providers would like to encourage you to use [facility] to communicate with providers for non-urgent requests or questions.  Due to long hold times on the telephone, sending your provider a message by [facility] may be a faster and more efficient way to get a response.  Please allow 48 business hours for a response.  Please remember that this is for non-urgent requests.   Due to recent changes in healthcare laws, you may see the results of your imaging and laboratory studies on MyChart before your provider has had a chance to review them.  We understand that in some cases there may be results that are confusing or concerning to you. Not all laboratory results come back in the same time frame and the provider may be waiting for multiple results in order to interpret others.  Please give [place] 48  hours in order for your provider to thoroughly review all the results before contacting the office for clarification of your results.

## 2021-04-18 ENCOUNTER — Telehealth: Payer: Self-pay

## 2021-04-18 NOTE — Telephone Encounter (Signed)
-----   Message from Minette Brine, [place] sent at 04/17/2021  4:35 PM EDT ----- Call to schedule appt her BP was up at Dr. Vena Rua office  ----- Message ----- From: Jerene Bears, MD Sent: 04/17/2021  12:08 PM EDT To: Minette Brine, FNP

## 2021-04-18 NOTE — Telephone Encounter (Signed)
I left pt vm to call the office she needs an OV due to her bp being elevated YL,RMA

## 2021-04-18 NOTE — Telephone Encounter (Signed)
Left pt vm to give [place] a call back to sch an appointment to check her BP.

## 2021-04-22 ENCOUNTER — Telehealth: Payer: Self-pay | Admitting: *Deleted

## 2021-04-22 MED ORDER — TRULANCE 3 MG PO TABS: 1.0000 | ORAL_TABLET | Freq: Every day | ORAL | 2 refills | Status: AC

## 2021-04-22 NOTE — Telephone Encounter (Signed)
Okay to try Trulance 3 mg once daily Patient should let us know if effective or ineffective after adequate trial

## 2021-04-22 NOTE — Telephone Encounter (Signed)
See note from patient and explained that we are trying Trulance due to her insurance

## 2021-04-22 NOTE — Telephone Encounter (Signed)
We have received a fax from [facility] indicating that patient's Motegrity has been denied. She must first have tried and failed Trulance. Patient must also have [facility] and NOT be on opioids.  Dr Hilarie Fredrickson, would you like me to try to send rx for Trulance?  (phone [phone], fax [phone])

## 2021-04-22 NOTE — Telephone Encounter (Signed)
I have left message for patient to call back. 

## 2021-04-22 NOTE — Telephone Encounter (Signed)
See patient my chart message.

## 2021-05-29 NOTE — Telephone Encounter (Signed)
Given that she is working with [facility] and is facing potential cholecystectomy, we can delay flex sig for 3-6 months, please make new recall Thanks JMP

## 2021-05-30 ENCOUNTER — Encounter (HOSPITAL_COMMUNITY): Payer: Self-pay | Admitting: Emergency Medicine

## 2021-05-30 ENCOUNTER — Other Ambulatory Visit: Payer: Self-pay

## 2021-05-30 ENCOUNTER — Emergency Department (HOSPITAL_COMMUNITY)
Admission: EM | Admit: 2021-05-30 | Discharge: 2021-05-30 | Disposition: A | Discharge status: Home / Self Care | Payer: Self-pay | Attending: Emergency Medicine | Admitting: Emergency Medicine

## 2021-05-30 ENCOUNTER — Other Ambulatory Visit: Payer: Self-pay | Admitting: General Surgery

## 2021-05-30 ENCOUNTER — Emergency Department (HOSPITAL_COMMUNITY): Payer: Self-pay

## 2021-05-30 DIAGNOSIS — R630 Anorexia: Secondary | ICD-10-CM | POA: Insufficient documentation

## 2021-05-30 DIAGNOSIS — R1084 Generalized abdominal pain: Secondary | ICD-10-CM

## 2021-05-30 DIAGNOSIS — Z87891 Personal history of nicotine dependence: Secondary | ICD-10-CM | POA: Insufficient documentation

## 2021-05-30 DIAGNOSIS — R1011 Right upper quadrant pain: Secondary | ICD-10-CM

## 2021-05-30 DIAGNOSIS — R110 Nausea: Secondary | ICD-10-CM | POA: Insufficient documentation

## 2021-05-30 LAB — COMPREHENSIVE METABOLIC PANEL
ALT: 13 U/L (ref 0–44)
AST: 20 U/L (ref 15–41)
Albumin: 4.4 g/dL (ref 3.5–5.0)
Alkaline Phosphatase: 67 U/L (ref 38–126)
Anion gap: 9 (ref 5–15)
BUN: 11 mg/dL (ref 6–20)
CO2: 23 mmol/L (ref 22–32)
Calcium: 9.3 mg/dL (ref 8.9–10.3)
Chloride: 103 mmol/L (ref 98–111)
Creatinine, Ser: 0.91 mg/dL (ref 0.44–1.00)
GFR, Estimated: >60 mL/min (ref >60)
Glucose, Bld: 86 mg/dL (ref 70–99)
Potassium: 3.8 mmol/L (ref 3.5–5.1)
Sodium: 135 mmol/L (ref 135–145)
Total Bilirubin: 1.1 mg/dL (ref 0.3–1.2)
Total Protein: 7.4 g/dL (ref 6.5–8.1)

## 2021-05-30 LAB — CBC WITH DIFFERENTIAL/PLATELET
Abs Immature Granulocytes: 0.01 10*3/uL (ref 0.00–0.07)
Basophils Absolute: 0 10*3/uL (ref 0.0–0.1)
Basophils Relative: 1 %
Eosinophils Absolute: 0 10*3/uL (ref 0.0–0.5)
Eosinophils Relative: 0 %
HCT: 43.1 % (ref 36.0–46.0)
Hemoglobin: 14 g/dL (ref 12.0–15.0)
Immature Granulocytes: 0 %
Lymphocytes Relative: 3 %
Lymphs Abs: 0.2 10*3/uL — ABNORMAL LOW (ref 0.7–4.0)
MCH: 28.7 pg (ref 26.0–34.0)
MCHC: 32.5 g/dL (ref 30.0–36.0)
MCV: 88.5 fL (ref 80.0–100.0)
Monocytes Absolute: 0.6 10*3/uL (ref 0.1–1.0)
Monocytes Relative: 9 %
Neutro Abs: 5.6 10*3/uL (ref 1.7–7.7)
Neutrophils Relative %: 87 %
Platelets: 225 10*3/uL (ref 150–400)
RBC: 4.87 MIL/uL (ref 3.87–5.11)
RDW: 13.4 % (ref 11.5–15.5)
WBC: 6.4 10*3/uL (ref 4.0–10.5)
nRBC: 0 % (ref 0.0–0.2)

## 2021-05-30 LAB — URINALYSIS, ROUTINE W REFLEX MICROSCOPIC
Bilirubin Urine: NEGATIVE
Glucose, UA: NEGATIVE mg/dL
Ketones, ur: 80 mg/dL — AB
Leukocytes,Ua: NEGATIVE
Nitrite: NEGATIVE
Protein, ur: 30 mg/dL — AB
Specific Gravity, Urine: 1.028 (ref 1.005–1.030)
pH: 5 (ref 5.0–8.0)

## 2021-05-30 LAB — I-STAT BETA HCG BLOOD, ED (MC, WL, AP ONLY): I-stat hCG, quantitative: <5 m[IU]/mL (ref <5)

## 2021-05-30 LAB — LIPASE, BLOOD: Lipase: 18 U/L (ref 11–51)

## 2021-05-30 MED ORDER — HYDROMORPHONE HCL 1 MG/ML IJ SOLN
1.0000 mg | Freq: Once | INTRAMUSCULAR | Status: AC
Start: 2021-05-30 — End: 2021-05-30
  Administered 2021-05-30: 1 mg via INTRAVENOUS
  Filled 2021-05-30: qty 1

## 2021-05-30 MED ORDER — HYDROCODONE-ACETAMINOPHEN 5-325 MG PO TABS: 1.0000 | ORAL_TABLET | Freq: Four times a day (QID) | ORAL | 0 refills | Status: DC | PRN

## 2021-05-30 MED ORDER — ONDANSETRON HCL 4 MG/2ML IJ SOLN
4.0000 mg | Freq: Once | INTRAMUSCULAR | Status: AC
  Administered 2021-05-30: 4 mg via INTRAVENOUS
  Filled 2021-05-30: qty 2

## 2021-05-30 MED ORDER — IOHEXOL 300 MG/ML  SOLN
100.0000 mL | Freq: Once | INTRAMUSCULAR | Status: AC | PRN
  Administered 2021-05-30: 100 mL via INTRAVENOUS

## 2021-05-30 MED ORDER — SODIUM CHLORIDE 0.9 % IV BOLUS
1000.0000 mL | Freq: Once | INTRAVENOUS | Status: AC
  Administered 2021-05-30: 1000 mL via INTRAVENOUS

## 2021-05-30 MED ORDER — DICYCLOMINE HCL 20 MG PO TABS: 20.0000 mg | ORAL_TABLET | Freq: Two times a day (BID) | ORAL | 0 refills | Status: AC

## 2021-05-30 NOTE — Discharge Instructions (Addendum)
As discussed, your evaluation today has been largely reassuring.  But, it is important that you monitor your condition carefully, and do not hesitate to return to the ED if you develop new, or concerning changes in your condition.  Otherwise, please follow-up with your physician for appropriate ongoing care.  Please discuss possibilities for your ongoing abdominal pain, possibly including biliary colic, IBS.

## 2021-05-30 NOTE — ED Triage Notes (Signed)
Patient complains of abdominal and lower back pain that started two years ago. Patient states abdomen has been getting hard and painful intermittently for the last two years. Patient alert, oriented, ambulatory, and in no apparent distress at this time.

## 2021-05-30 NOTE — ED Notes (Signed)
abd pain since this am no n vomiting or diarrhea

## 2021-05-30 NOTE — ED Provider Notes (Signed)
Emergency Medicine Provider Triage Evaluation Note  Caitlin Ingram 40 y.o. female was evaluated in triage.  Pt complains of diffuse abdominal pain, lower back pain.  She reports this is been an ongoing issue for 2 years.  Her most recent episode started about 2 to 3 weeks ago.  She states that her whole abdomen has been hurting.  No nausea, vomiting, fevers.  No dysuria, hematuria.   Review of Systems  Positive: Abdominal pain, back pain Negative: Fevers, nausea/vomiting, dysuria, hematuri  Physical Exam  BP 134/82   Pulse 70   Temp 98.2 F (36.8 C) (Oral)   Resp 18   Ht 5\' 4"  (1.626 m)   Wt 65.8 kg   SpO2 100%   BMI 24.89 kg/m  Gen:   Awake, no distress   HEENT:  Atraumatic  Resp:  Normal effort  Cardiac:  Normal rate  Abd:   Nondistended, mild generalized tenderness.  No rigidity, guarding. MSK:   Moves extremities without difficulty  Neuro:  Speech clear   Other:      Medical Decision Making  Medically screening exam initiated at 12:00 PM  Appropriate orders placed.  Caitlin Ingram was informed that the remainder of the evaluation will be completed by another provider, this initial triage assessment does not replace that evaluation. They are counseled that they will need to remain in the ED until the completion of their workup, including full H&P and results of any tests.  Risks of leaving the emergency department prior to completion of treatment were discussed. Patient was advised to inform ED staff if they are leaving before their treatment is complete. The patient acknowledged these risks and time was allowed for questions.     The patient appears stable so that the remainder of the MSE may be completed by another provider.    Clinical Impression  Abdominal pain, back pain.   Portions of this note were generated with Lobbyist. Dictation errors may occur despite best attempts at proofreading.     Volanda Napoleon, PA-C 05/30/21 1201     Valarie Merino, MD 06/01/21 2055

## 2021-05-30 NOTE — ED Provider Notes (Signed)
[facility]ERGENCY DEPARTMENT Provider Note   CSN: [id number] Arrival date & time: 05/30/21  1149     History Chief Complaint  Patient presents with   Abdominal Pain    Caitlin Ingram is a 40 y.o. female.  HPI Patient presents with continued abdominal pain. Pain is focally in the right upper quadrant, though there are some radiation across the superior abdomen. There is associated nausea, anorexia, no, no constipation She has had symptoms for almost 2 years, now worse over the past few days.  She has been seen and evaluated by gastroenterology.  She notes that she has not seen other physicians as well.  She notes that she typically was diagnosed with constipation, but does not have that is a current issue. Today she presents due to persistent pain in the right upper quadrant, concern for biliary dysfunction.    Past Medical History:  Diagnosis Date   Carcinoid tumor of colon 01/17/2010   fully resected   Chronic constipation    Hyperplastic colon polyp    Kidney stones    Renal disorder    renal calculi    Patient Active Problem List   Diagnosis Date Noted   Chronic constipation 01/04/2020   Valgus deformity of both great toes 01/11/2019   Pain in right knee 03/03/2018    Past Surgical History:  Procedure Laterality Date   BUNIONECTOMY Left    CYST REMOVAL NECK  1990     OB History   No obstetric history on file.     Family History  Problem Relation Age of Onset   Hypertension Mother    Hypertension Father    Diabetes Brother    Colon cancer Neg Hx    Esophageal cancer Neg Hx    Pancreatic cancer Neg Hx    Stomach cancer Neg Hx     Social History   Tobacco Use   Smoking status: Former    Pack years: 0.00    Types: Cigars   Smokeless tobacco: Never   Tobacco comments:    black and mild  Vaping Use   Vaping Use: Never used  Substance Use Topics   Alcohol use: Yes    Comment: occassionally   Drug use: Yes     Types: Marijuana    Home Medications Prior to Admission medications   Medication Sig Start Date End Date Taking? Authorizing Provider  Docusate Calcium (STOOL SOFTENER PO) Take 1 tablet by mouth daily.    [provider]  Plecanatide (TRULANCE) 3 MG TABS Take 1 tablet by mouth daily. Pharmacy-please d/c rx for motegrity. Insurance will not cover 04/22/21   Pyrtle, Lajuan Lines, MD    Allergies    Amoxicillin and Penicillins  Review of Systems   Review of Systems  Constitutional:        Per HPI, otherwise negative  HENT:         Per HPI, otherwise negative  Respiratory:         Per HPI, otherwise negative  Cardiovascular:        Per HPI, otherwise negative  Gastrointestinal:  Negative for vomiting.  Endocrine:       Negative aside from HPI  Genitourinary:        Neg aside from HPI   Musculoskeletal:        Per HPI, otherwise negative  Skin: Negative.   Neurological:  Negative for syncope.   Physical Exam Updated Vital Signs BP (!) 162/95 (BP Location: Right Arm)  Pulse 79   Temp 100 F (37.8 C) (Oral)   Resp 18   SpO2 100%   Physical Exam Vitals and nursing note reviewed.  Constitutional:      General: She is not in acute distress.    Appearance: She is well-developed.  HENT:     Head: Normocephalic and atraumatic.  Eyes:     Conjunctiva/sclera: Conjunctivae normal.  Cardiovascular:     Rate and Rhythm: Normal rate and regular rhythm.  Pulmonary:     Effort: Pulmonary effort is normal. No respiratory distress.     Breath sounds: Normal breath sounds. No stridor.  Abdominal:     General: There is no distension.     Tenderness: There is abdominal tenderness in the right upper quadrant and epigastric area. There is guarding.  Skin:    General: Skin is warm and dry.  Neurological:     Mental Status: She is alert and oriented to person, place, and time.     Cranial Nerves: No cranial nerve deficit.    ED Results / Procedures / Treatments   Labs (all  labs ordered are listed, but only abnormal results are displayed) Labs Reviewed  CBC WITH DIFFERENTIAL/PLATELET - Abnormal; Notable for the following components:      Result Value   Lymphs Abs 0.2 (*)    All other components within normal limits  URINALYSIS, ROUTINE W REFLEX MICROSCOPIC - Abnormal; Notable for the following components:   APPearance HAZY (*)    Hgb urine dipstick MODERATE (*)    Ketones, ur 80 (*)    Protein, ur 30 (*)    Bacteria, UA RARE (*)    All other components within normal limits  COMPREHENSIVE METABOLIC PANEL  LIPASE, BLOOD  I-STAT BETA HCG BLOOD, ED ([facility], [facility], [facility] ONLY)    EKG None  Radiology CT Abdomen Pelvis W Contrast  Result Date: 05/30/2021 CLINICAL DATA:  Diverticulitis suspected, acute on chronic abdominal pain and nausea EXAM: CT ABDOMEN AND PELVIS WITH CONTRAST TECHNIQUE: Multidetector CT imaging of the abdomen and pelvis was performed using the standard protocol following bolus administration of intravenous contrast. CONTRAST:  148mL OMNIPAQUE IOHEXOL 300 MG/ML  SOLN COMPARISON:  None. FINDINGS: Lower chest: No acute abnormality. Hepatobiliary: No solid liver abnormality is seen. No gallstones, gallbladder wall thickening, or biliary dilatation. Pancreas: Unremarkable. No pancreatic ductal dilatation or surrounding inflammatory changes. Spleen: Normal in size without significant abnormality. Adrenals/Urinary Tract: Adrenal glands are unremarkable. Kidneys are normal, without renal calculi, solid lesion, or hydronephrosis. Bladder is unremarkable. Stomach/Bowel: Stomach is within normal limits. Appendix appears normal. No evidence of bowel wall thickening, distention, or inflammatory changes. Vascular/Lymphatic: No significant vascular findings are present. No enlarged abdominal or pelvic lymph nodes. Reproductive: No mass or other significant abnormality. Small bilateral benign, functional cysts and follicles of the ovaries. Other: Small, fat containing  umbilical hernia. No abdominopelvic ascites. Musculoskeletal: No acute or significant osseous findings. IMPRESSION: 1. No CT findings of the abdomen or pelvis to explain nausea or pain. 2. No evidence of acute diverticulitis. No significant diverticular disease of the colon. Electronically Signed   By: Eddie Candle M.D.   On: 05/30/2021 18:17    Procedures Procedures   Medications Ordered in ED Medications - No data to display  ED Course  I have reviewed the triage vital signs and the nursing notes.  Pertinent labs & imaging results that were available during my care of the patient were reviewed by me and considered in my medical decision making (  see chart for details).  7:38 PM   On repeat exam patient is awake, alert, calm.  She has received analgesia here.  We reviewed the CT which I have reviewed as well. No evidence for notable findings.  With no ongoing pain, the patient is appropriate for discharge to follow-up with her GI colleagues. Low suspicion for occult infection, no evidence for bacteremia, sepsis, patient has no urinary complaints suggesting UTI as etiology.  Some suspicion for IBS versus IBD or biliary colic contributing to her symptoms. Final Clinical Impression(s) / ED Diagnoses Final diagnoses:  Generalized abdominal pain    Rx / DC Orders ED Discharge Orders          Ordered    HYDROcodone-acetaminophen (NORCO/VICODIN) 5-325 MG tablet  Every 6 hours PRN        05/30/21 1928    dicyclomine (BENTYL) 20 MG tablet  2 times daily        05/30/21 1928             Carmin Muskrat, MD 05/30/21 1939

## 2021-06-10 ENCOUNTER — Encounter: Payer: Self-pay | Admitting: *Deleted

## 2021-06-18 ENCOUNTER — Ambulatory Visit
Admission: RE | Admit: 2021-06-18 | Discharge: 2021-06-18 | Disposition: A | Discharge status: Home / Self Care | Payer: Managed Care, Other (non HMO) | Source: Ambulatory Visit | Attending: General Surgery | Admitting: General Surgery

## 2021-06-18 DIAGNOSIS — R1011 Right upper quadrant pain: Secondary | ICD-10-CM

## 2021-06-20 ENCOUNTER — Other Ambulatory Visit: Payer: 59 | Admitting: Internal Medicine

## 2021-06-23 ENCOUNTER — Encounter: Payer: Self-pay | Admitting: *Deleted

## 2021-07-11 NOTE — Progress Notes (Signed)
Pt. Needs orders for upcoming surgery.PAT and labs on 07/14/21.

## 2021-07-11 NOTE — Patient Instructions (Signed)
DUE TO COVID-19 ONLY ONE VISITOR IS ALLOWED TO COME WITH YOU AND STAY IN THE WAITING ROOM ONLY DURING PRE OP AND PROCEDURE DAY OF SURGERY. THE 1 VISITOR  MAY VISIT WITH YOU AFTER SURGERY IN YOUR PRIVATE ROOM DURING VISITING HOURS ONLY!               Caitlin Ingram   Your procedure is scheduled on: 07/15/21   Report to [facility] Main  Entrance   Report to admitting at: 8:15 AM     Call this number if you have problems the morning of surgery [phone]    Remember: Do not eat solid food :After Midnight. Clear liquids until: 7:30 AM.  CLEAR LIQUID DIET  Foods Allowed                                                                     Foods Excluded  Coffee and tea, regular and decaf                             liquids that you cannot  Plain Jell-O any favor except red or purple                                           see through such as: Fruit ices (not with fruit pulp)                                     milk, soups, orange juice  Iced Popsicles                                    All solid food Carbonated beverages, regular and diet                                    Cranberry, grape and apple juices Sports drinks like Gatorade Lightly seasoned clear broth or consume(fat free) Sugar, honey syrup  Sample Menu Breakfast                                Lunch                                     Supper Cranberry juice                    Beef broth                            Chicken broth Jell-O                                     Grape  juice                           Apple juice Coffee or tea                        Jell-O                                      Popsicle                                                Coffee or tea                        Coffee or tea  _____________________________________________________________________   BRUSH YOUR TEETH MORNING OF SURGERY AND RINSE YOUR MOUTH OUT, NO CHEWING GUM CANDY OR MINTS.    Take these medicines the morning of  surgery with A SIP OF WATER: N/A                               You may not have any metal on your body including hair pins and              piercings  Do not wear jewelry, make-up, lotions, powders or perfumes, deodorant             Do not wear nail polish on your fingernails.  Do not shave  48 hours prior to surgery.    Do not bring valuables to the hospital. [place].  Contacts, dentures or bridgework may not be worn into surgery.  Leave suitcase in the car. After surgery it may be brought to your room.     Patients discharged the day of surgery will not be allowed to drive home. IF YOU ARE HAVING SURGERY AND GOING HOME THE SAME DAY, YOU MUST HAVE AN ADULT TO DRIVE YOU HOME AND BE WITH YOU FOR 24 HOURS. YOU MAY GO HOME BY TAXI OR UBER OR ORTHERWISE, BUT AN ADULT MUST ACCOMPANY YOU HOME AND STAY WITH YOU FOR 24 HOURS.  Name and phone number of your driver:  Special Instructions: N/A              Please read over the following fact sheets you were given: ____________________________________________________________________           [facility] - Preparing for Surgery Before surgery, you can play an important role.  Because skin is not sterile, your skin needs to be as free of germs as possible.  You can reduce the number of germs on your skin by washing with CHG (chlorahexidine gluconate) soap before surgery.  CHG is an antiseptic cleaner which kills germs and bonds with the skin to continue killing germs even after washing. Please DO NOT use if you have an allergy to CHG or antibacterial soaps.  If your skin becomes reddened/irritated stop using the CHG and inform your nurse when you arrive at Short Stay. Do not shave (including legs and underarms) for at least 48 hours prior to the first CHG  shower.  You may shave your face/neck. Please follow these instructions carefully:  1.  Shower with CHG Soap the night before surgery and the  morning  of Surgery.  2.  If you choose to wash your hair, wash your hair first as usual with your  normal  shampoo.  3.  After you shampoo, rinse your hair and body thoroughly to remove the  shampoo.                           4.  Use CHG as you would any other liquid soap.  You can apply chg directly  to the skin and wash                       Gently with a scrungie or clean washcloth.  5.  Apply the CHG Soap to your body ONLY FROM THE NECK DOWN.   Do not use on face/ open                           Wound or open sores. Avoid contact with eyes, ears mouth and genitals (private parts).                       Wash face,  Genitals (private parts) with your normal soap.             6.  Wash thoroughly, paying special attention to the area where your surgery  will be performed.  7.  Thoroughly rinse your body with warm water from the neck down.  8.  DO NOT shower/wash with your normal soap after using and rinsing off  the CHG Soap.                9.  Pat yourself dry with a clean towel.            10.  Wear clean pajamas.            11.  Place clean sheets on your bed the night of your first shower and do not  sleep with pets. Day of Surgery : Do not apply any lotions/deodorants the morning of surgery.  Please wear clean clothes to the hospital/surgery center.  FAILURE TO FOLLOW THESE INSTRUCTIONS MAY RESULT IN THE CANCELLATION OF YOUR SURGERY PATIENT SIGNATURE_________________________________  NURSE SIGNATURE__________________________________  ________________________________________________________________________

## 2021-07-14 ENCOUNTER — Ambulatory Visit (HOSPITAL_COMMUNITY): Payer: 59 | Admitting: Certified Registered Nurse Anesthetist

## 2021-07-14 ENCOUNTER — Encounter (HOSPITAL_COMMUNITY)
Admission: RE | Admit: 2021-07-14 | Discharge: 2021-07-14 | Disposition: A | Discharge status: Home / Self Care | Payer: 59 | Source: Ambulatory Visit | Attending: General Surgery | Admitting: General Surgery

## 2021-07-14 ENCOUNTER — Other Ambulatory Visit: Payer: Self-pay

## 2021-07-14 ENCOUNTER — Encounter (HOSPITAL_COMMUNITY): Payer: Self-pay

## 2021-07-14 DIAGNOSIS — Z01812 Encounter for preprocedural laboratory examination: Secondary | ICD-10-CM | POA: Diagnosis not present

## 2021-07-14 HISTORY — DX: Personal history of urinary calculi: Z87.442

## 2021-07-14 LAB — CBC
HCT: 39.2 % (ref 36.0–46.0)
Hemoglobin: 12.7 g/dL (ref 12.0–15.0)
MCH: 29.5 pg (ref 26.0–34.0)
MCHC: 32.4 g/dL (ref 30.0–36.0)
MCV: 91.2 fL (ref 80.0–100.0)
Platelets: 214 10*3/uL (ref 150–400)
RBC: 4.3 MIL/uL (ref 3.87–5.11)
RDW: 14.9 % (ref 11.5–15.5)
WBC: 7.3 10*3/uL (ref 4.0–10.5)
nRBC: 0 % (ref 0.0–0.2)

## 2021-07-14 NOTE — Progress Notes (Addendum)
COVID Vaccine Completed: NO Date COVID Vaccine completed: COVID vaccine manufacturer: [employer]  Covid test: N/A PCP - Minette Brine: FNP Cardiologist -   Chest x-ray -  EKG -  Stress Test -  ECHO -  Cardiac Cath -  Pacemaker/ICD device last checked:  Sleep Study -  CPAP -   Fasting Blood Sugar -  Checks Blood Sugar _____ times a day  Blood Thinner Instructions: Aspirin Instructions: Last Dose:  Anesthesia review: smoker  Patient denies shortness of breath, fever, cough and chest pain at PAT appointment   Patient verbalized understanding of instructions that were given to them at the PAT appointment. Patient was also instructed that they will need to review over the PAT instructions again at home before surgery.

## 2021-07-15 ENCOUNTER — Encounter (HOSPITAL_COMMUNITY): Payer: Self-pay | Admitting: General Surgery

## 2021-07-15 ENCOUNTER — Ambulatory Visit (HOSPITAL_COMMUNITY)
Admission: RE | Admit: 2021-07-15 | Discharge: 2021-07-15 | Disposition: A | Discharge status: Home / Self Care | Payer: 59 | Source: Ambulatory Visit | Attending: General Surgery | Admitting: General Surgery

## 2021-07-15 ENCOUNTER — Encounter (HOSPITAL_COMMUNITY)
Admission: RE | Disposition: A | Discharge status: Home / Self Care | Payer: Self-pay | Source: Ambulatory Visit | Attending: General Surgery

## 2021-07-15 DIAGNOSIS — Z01818 Encounter for other preprocedural examination: Secondary | ICD-10-CM | POA: Diagnosis present

## 2021-07-15 DIAGNOSIS — Z538 Procedure and treatment not carried out for other reasons: Secondary | ICD-10-CM | POA: Insufficient documentation

## 2021-07-15 LAB — PREGNANCY, URINE: Preg Test, Ur: NEGATIVE

## 2021-07-15 SURGERY — LAPAROSCOPIC CHOLECYSTECTOMY
Anesthesia: General

## 2021-07-15 MED ORDER — LIDOCAINE 2% (20 MG/ML) 5 ML SYRINGE
INTRAMUSCULAR | Status: AC
  Filled 2021-07-15: qty 5

## 2021-07-15 MED ORDER — ACETAMINOPHEN 500 MG PO TABS
1000.0000 mg | ORAL_TABLET | ORAL | Status: AC
  Administered 2021-07-15: 1000 mg via ORAL

## 2021-07-15 MED ORDER — ONDANSETRON HCL 4 MG/2ML IJ SOLN
INTRAMUSCULAR | Status: AC
  Filled 2021-07-15: qty 2

## 2021-07-15 MED ORDER — MIDAZOLAM HCL 2 MG/2ML IJ SOLN
INTRAMUSCULAR | Status: AC
  Filled 2021-07-15: qty 2

## 2021-07-15 MED ORDER — LACTATED RINGERS IV SOLN: INTRAVENOUS | Status: DC

## 2021-07-15 MED ORDER — PROPOFOL 10 MG/ML IV BOLUS
INTRAVENOUS | Status: AC
  Filled 2021-07-15: qty 20

## 2021-07-15 MED ORDER — DEXAMETHASONE SODIUM PHOSPHATE 10 MG/ML IJ SOLN
INTRAMUSCULAR | Status: AC
  Filled 2021-07-15: qty 1

## 2021-07-15 MED ORDER — CHLORHEXIDINE GLUCONATE CLOTH 2 % EX PADS: 6.0000 | MEDICATED_PAD | Freq: Once | CUTANEOUS | Status: DC

## 2021-07-15 MED ORDER — ORAL CARE MOUTH RINSE: 15.0000 mL | Freq: Once | OROMUCOSAL | Status: AC

## 2021-07-15 MED ORDER — CEFAZOLIN SODIUM-DEXTROSE 2-4 GM/100ML-% IV SOLN: 2.0000 g | INTRAVENOUS | Status: DC

## 2021-07-15 MED ORDER — ROCURONIUM BROMIDE 10 MG/ML (PF) SYRINGE
PREFILLED_SYRINGE | INTRAVENOUS | Status: AC
  Filled 2021-07-15: qty 10

## 2021-07-15 MED ORDER — CHLORHEXIDINE GLUCONATE 0.12 % MT SOLN
15.0000 mL | Freq: Once | OROMUCOSAL | Status: AC
  Administered 2021-07-15: 15 mL via OROMUCOSAL

## 2021-07-15 MED ORDER — ACETAMINOPHEN 500 MG PO TABS
ORAL_TABLET | ORAL | Status: AC
  Filled 2021-07-15: qty 2

## 2021-07-15 MED ORDER — FENTANYL CITRATE (PF) 100 MCG/2ML IJ SOLN
INTRAMUSCULAR | Status: AC
  Filled 2021-07-15: qty 2

## 2021-07-15 NOTE — Progress Notes (Signed)
Surgery cancelled as Dr Kieth Brightly has covid.

## 2021-07-15 NOTE — Anesthesia Preprocedure Evaluation (Deleted)
Anesthesia Evaluation    Reviewed: Allergy & Precautions, Patient's Chart, lab work & pertinent test results  History of Anesthesia Complications Negative for: history of anesthetic complications  Airway        Dental   Pulmonary former smoker,           Cardiovascular negative cardio ROS       Neuro/Psych negative neurological ROS  negative psych ROS   GI/Hepatic (+)     substance abuse  marijuana use,  Hx carcinoid tumor of colon s/p resection    Endo/Other   Obesity   Renal/GU negative Renal ROS     Musculoskeletal negative musculoskeletal ROS (+)   Abdominal   Peds  Hematology negative hematology ROS (+)   Anesthesia Other Findings   Reproductive/Obstetrics                             Anesthesia Physical Anesthesia Plan  ASA: 2  Anesthesia Plan: General   Post-op Pain Management:    Induction: Intravenous  PONV Risk Score and Plan: 4 or greater and Treatment may vary due to age or medical condition, Ondansetron, Midazolam, Scopolamine patch - Pre-op and Dexamethasone  Airway Management Planned: Oral ETT  Additional Equipment: None  Intra-op Plan:   Post-operative Plan: Extubation in OR  Informed Consent:   Plan Discussed with: CRNA and Anesthesiologist  Anesthesia Plan Comments:         Anesthesia Quick Evaluation

## 2021-07-21 ENCOUNTER — Encounter (HOSPITAL_COMMUNITY): Payer: Self-pay | Admitting: General Surgery

## 2021-07-21 ENCOUNTER — Other Ambulatory Visit: Payer: Self-pay

## 2021-07-21 ENCOUNTER — Ambulatory Visit (HOSPITAL_COMMUNITY)
Admission: RE | Admit: 2021-07-21 | Discharge: 2021-07-21 | Disposition: A | Discharge status: Home / Self Care | Payer: 59 | Source: Ambulatory Visit | Attending: General Surgery | Admitting: General Surgery

## 2021-07-21 ENCOUNTER — Ambulatory Visit (HOSPITAL_COMMUNITY): Payer: 59 | Admitting: Certified Registered Nurse Anesthetist

## 2021-07-21 ENCOUNTER — Encounter (HOSPITAL_COMMUNITY)
Admission: RE | Disposition: A | Discharge status: Home / Self Care | Payer: Self-pay | Source: Ambulatory Visit | Attending: General Surgery

## 2021-07-21 DIAGNOSIS — Z87442 Personal history of urinary calculi: Secondary | ICD-10-CM | POA: Insufficient documentation

## 2021-07-21 DIAGNOSIS — Z79899 Other long term (current) drug therapy: Secondary | ICD-10-CM | POA: Insufficient documentation

## 2021-07-21 DIAGNOSIS — F1290 Cannabis use, unspecified, uncomplicated: Secondary | ICD-10-CM | POA: Diagnosis not present

## 2021-07-21 DIAGNOSIS — Z87891 Personal history of nicotine dependence: Secondary | ICD-10-CM | POA: Diagnosis not present

## 2021-07-21 DIAGNOSIS — Z880 Allergy status to penicillin: Secondary | ICD-10-CM | POA: Diagnosis not present

## 2021-07-21 DIAGNOSIS — Z86010 Personal history of colonic polyps: Secondary | ICD-10-CM | POA: Insufficient documentation

## 2021-07-21 DIAGNOSIS — K8010 Calculus of gallbladder with chronic cholecystitis without obstruction: Secondary | ICD-10-CM | POA: Diagnosis not present

## 2021-07-21 HISTORY — PX: CHOLECYSTECTOMY: SHX55

## 2021-07-21 LAB — PREGNANCY, URINE: Preg Test, Ur: NEGATIVE

## 2021-07-21 SURGERY — LAPAROSCOPIC CHOLECYSTECTOMY
Anesthesia: General | Site: Abdomen

## 2021-07-21 MED ORDER — CEFAZOLIN SODIUM-DEXTROSE 2-4 GM/100ML-% IV SOLN
2.0000 g | INTRAVENOUS | Status: AC
  Administered 2021-07-21: 2 g via INTRAVENOUS
  Filled 2021-07-21: qty 100

## 2021-07-21 MED ORDER — MIDAZOLAM HCL 2 MG/2ML IJ SOLN
INTRAMUSCULAR | Status: AC
  Filled 2021-07-21: qty 2

## 2021-07-21 MED ORDER — ONDANSETRON HCL 4 MG/2ML IJ SOLN
INTRAMUSCULAR | Status: AC
  Filled 2021-07-21: qty 2

## 2021-07-21 MED ORDER — 0.9 % SODIUM CHLORIDE (POUR BTL) OPTIME
TOPICAL | Status: DC | PRN
  Administered 2021-07-21: 1000 mL

## 2021-07-21 MED ORDER — ONDANSETRON HCL 4 MG/2ML IJ SOLN
INTRAMUSCULAR | Status: DC | PRN
  Administered 2021-07-21: 4 mg via INTRAVENOUS

## 2021-07-21 MED ORDER — BUPIVACAINE-EPINEPHRINE 0.25% -1:200000 IJ SOLN
INTRAMUSCULAR | Status: DC | PRN
  Administered 2021-07-21: 30 mL

## 2021-07-21 MED ORDER — SUGAMMADEX SODIUM 200 MG/2ML IV SOLN
INTRAVENOUS | Status: DC | PRN
  Administered 2021-07-21: 180 mg via INTRAVENOUS

## 2021-07-21 MED ORDER — MIDAZOLAM HCL 5 MG/5ML IJ SOLN
INTRAMUSCULAR | Status: DC | PRN
  Administered 2021-07-21: 2 mg via INTRAVENOUS

## 2021-07-21 MED ORDER — LABETALOL HCL 5 MG/ML IV SOLN
INTRAVENOUS | Status: AC
  Administered 2021-07-21: 5 mg via INTRAVENOUS
  Filled 2021-07-21: qty 4

## 2021-07-21 MED ORDER — CHLORHEXIDINE GLUCONATE CLOTH 2 % EX PADS: 6.0000 | MEDICATED_PAD | Freq: Once | CUTANEOUS | Status: DC

## 2021-07-21 MED ORDER — DEXAMETHASONE SODIUM PHOSPHATE 10 MG/ML IJ SOLN
INTRAMUSCULAR | Status: DC | PRN
  Administered 2021-07-21: 4 mg via INTRAVENOUS

## 2021-07-21 MED ORDER — BUPIVACAINE-EPINEPHRINE (PF) 0.25% -1:200000 IJ SOLN
INTRAMUSCULAR | Status: AC
  Filled 2021-07-21: qty 30

## 2021-07-21 MED ORDER — LACTATED RINGERS IV SOLN: INTRAVENOUS | Status: DC

## 2021-07-21 MED ORDER — LACTATED RINGERS IR SOLN
Status: DC | PRN
  Administered 2021-07-21: 1000 mL

## 2021-07-21 MED ORDER — PROMETHAZINE HCL 25 MG/ML IJ SOLN: 6.2500 mg | INTRAMUSCULAR | Status: DC | PRN

## 2021-07-21 MED ORDER — FENTANYL CITRATE PF 50 MCG/ML IJ SOSY
25.0000 ug | PREFILLED_SYRINGE | INTRAMUSCULAR | Status: DC | PRN
  Administered 2021-07-21 (×2): 25 ug via INTRAVENOUS

## 2021-07-21 MED ORDER — HYDRALAZINE HCL 20 MG/ML IJ SOLN
INTRAMUSCULAR | Status: AC
  Filled 2021-07-21: qty 1

## 2021-07-21 MED ORDER — FENTANYL CITRATE (PF) 250 MCG/5ML IJ SOLN
INTRAMUSCULAR | Status: AC
  Filled 2021-07-21: qty 5

## 2021-07-21 MED ORDER — OXYCODONE HCL 5 MG PO TABS: 5.0000 mg | ORAL_TABLET | Freq: Four times a day (QID) | ORAL | 0 refills | Status: AC | PRN

## 2021-07-21 MED ORDER — FENTANYL CITRATE PF 50 MCG/ML IJ SOSY
PREFILLED_SYRINGE | INTRAMUSCULAR | Status: AC
  Administered 2021-07-21: 25 ug via INTRAVENOUS
  Filled 2021-07-21: qty 1

## 2021-07-21 MED ORDER — LABETALOL HCL 5 MG/ML IV SOLN: 5.0000 mg | INTRAVENOUS | Status: DC | PRN

## 2021-07-21 MED ORDER — ROCURONIUM BROMIDE 10 MG/ML (PF) SYRINGE
PREFILLED_SYRINGE | INTRAVENOUS | Status: DC | PRN
  Administered 2021-07-21: 60 mg via INTRAVENOUS

## 2021-07-21 MED ORDER — OXYCODONE HCL 5 MG PO TABS: 5.0000 mg | ORAL_TABLET | Freq: Once | ORAL | Status: DC | PRN

## 2021-07-21 MED ORDER — FENTANYL CITRATE PF 50 MCG/ML IJ SOSY
PREFILLED_SYRINGE | INTRAMUSCULAR | Status: AC
  Filled 2021-07-21: qty 1

## 2021-07-21 MED ORDER — PROPOFOL 10 MG/ML IV BOLUS
INTRAVENOUS | Status: AC
  Filled 2021-07-21: qty 20

## 2021-07-21 MED ORDER — FENTANYL CITRATE (PF) 100 MCG/2ML IJ SOLN
INTRAMUSCULAR | Status: AC
  Filled 2021-07-21: qty 2

## 2021-07-21 MED ORDER — DEXAMETHASONE SODIUM PHOSPHATE 10 MG/ML IJ SOLN
INTRAMUSCULAR | Status: AC
  Filled 2021-07-21: qty 1

## 2021-07-21 MED ORDER — FENTANYL CITRATE (PF) 100 MCG/2ML IJ SOLN
INTRAMUSCULAR | Status: DC | PRN
  Administered 2021-07-21: 100 ug via INTRAVENOUS
  Administered 2021-07-21 (×2): 50 ug via INTRAVENOUS

## 2021-07-21 MED ORDER — IBUPROFEN 800 MG PO TABS: 800.0000 mg | ORAL_TABLET | Freq: Three times a day (TID) | ORAL | 0 refills | Status: AC | PRN

## 2021-07-21 MED ORDER — OXYCODONE HCL 5 MG/5ML PO SOLN: 5.0000 mg | Freq: Once | ORAL | Status: DC | PRN

## 2021-07-21 MED ORDER — LABETALOL HCL 5 MG/ML IV SOLN
5.0000 mg | INTRAVENOUS | Status: AC | PRN
  Administered 2021-07-21 (×2): 5 mg via INTRAVENOUS

## 2021-07-21 MED ORDER — ACETAMINOPHEN 500 MG PO TABS
1000.0000 mg | ORAL_TABLET | ORAL | Status: AC
  Administered 2021-07-21: 1000 mg via ORAL
  Filled 2021-07-21: qty 2

## 2021-07-21 MED ORDER — LIDOCAINE 2% (20 MG/ML) 5 ML SYRINGE
INTRAMUSCULAR | Status: DC | PRN
  Administered 2021-07-21: 40 mg via INTRAVENOUS

## 2021-07-21 MED ORDER — HYDRALAZINE HCL 20 MG/ML IJ SOLN
INTRAMUSCULAR | Status: DC | PRN
  Administered 2021-07-21: 5 mg via INTRAVENOUS

## 2021-07-21 MED ORDER — PROPOFOL 10 MG/ML IV BOLUS
INTRAVENOUS | Status: DC | PRN
  Administered 2021-07-21: 160 mg via INTRAVENOUS

## 2021-07-21 MED ORDER — HYDRALAZINE HCL 20 MG/ML IJ SOLN
10.0000 mg | Freq: Once | INTRAMUSCULAR | Status: AC
  Administered 2021-07-21: 10 mg via INTRAVENOUS

## 2021-07-21 SURGICAL SUPPLY — 40 items
APPLIER CLIP 5 13 M/L LIGAMAX5 (MISCELLANEOUS)
APPLIER CLIP ROT 10 11.4 M/L (STAPLE)
BAG COUNTER SPONGE SURGICOUNT (BAG) IMPLANT
BENZOIN TINCTURE PRP APPL 2/3 (GAUZE/BANDAGES/DRESSINGS) ×2 IMPLANT
BNDG ADH 1X3 SHEER STRL LF (GAUZE/BANDAGES/DRESSINGS) ×8 IMPLANT
CABLE HIGH FREQUENCY MONO STRZ (ELECTRODE) ×2 IMPLANT
CHLORAPREP W/TINT 26 (MISCELLANEOUS) ×2 IMPLANT
CLIP APPLIE 5 13 M/L LIGAMAX5 (MISCELLANEOUS) IMPLANT
CLIP APPLIE ROT 10 11.4 M/L (STAPLE) IMPLANT
CLIP LIGATING HEMO O LOK GREEN (MISCELLANEOUS) ×2 IMPLANT
COVER MAYO STAND STRL (DRAPES) ×2 IMPLANT
COVER SURGICAL LIGHT HANDLE (MISCELLANEOUS) ×2 IMPLANT
DECANTER SPIKE VIAL GLASS SM (MISCELLANEOUS) ×2 IMPLANT
DRAIN CHANNEL 19F RND (DRAIN) IMPLANT
DRAPE C-ARM 42X120 X-RAY (DRAPES) IMPLANT
EVACUATOR SILICONE 100CC (DRAIN) IMPLANT
GLOVE SURG POLYISO LF SZ7 (GLOVE) ×2 IMPLANT
GLOVE SURG UNDER POLY LF SZ7 (GLOVE) ×2 IMPLANT
GOWN STRL REUS W/TWL LRG LVL3 (GOWN DISPOSABLE) ×2 IMPLANT
GOWN STRL REUS W/TWL XL LVL3 (GOWN DISPOSABLE) ×4 IMPLANT
GRASPER SUT TROCAR 14GX15 (MISCELLANEOUS) IMPLANT
KIT BASIN OR (CUSTOM PROCEDURE TRAY) ×2 IMPLANT
KIT TURNOVER KIT A (KITS) ×2 IMPLANT
POUCH RETRIEVAL ECOSAC 10 (ENDOMECHANICALS) ×1 IMPLANT
POUCH RETRIEVAL ECOSAC 10MM (ENDOMECHANICALS) ×2
SCISSORS LAP 5X35 DISP (ENDOMECHANICALS) ×2 IMPLANT
SET CHOLANGIOGRAPH MIX (MISCELLANEOUS) IMPLANT
SET IRRIG TUBING LAPAROSCOPIC (IRRIGATION / IRRIGATOR) ×2 IMPLANT
SET TUBE SMOKE EVAC HIGH FLOW (TUBING) ×2 IMPLANT
SLEEVE XCEL OPT CAN 5 100 (ENDOMECHANICALS) ×4 IMPLANT
STOPCOCK 4 WAY LG BORE MALE ST (IV SETS) IMPLANT
STRIP CLOSURE SKIN 1/2X4 (GAUZE/BANDAGES/DRESSINGS) ×2 IMPLANT
SUT ETHILON 2 0 PS N (SUTURE) IMPLANT
SUT MNCRL AB 4-0 PS2 18 (SUTURE) ×2 IMPLANT
SUT VICRYL 0 ENDOLOOP (SUTURE) IMPLANT
TOWEL OR 17X26 10 PK STRL BLUE (TOWEL DISPOSABLE) ×2 IMPLANT
TOWEL OR NON WOVEN STRL DISP B (DISPOSABLE) ×2 IMPLANT
TRAY LAPAROSCOPIC (CUSTOM PROCEDURE TRAY) ×2 IMPLANT
TROCAR BLADELESS OPT 5 100 (ENDOMECHANICALS) ×2 IMPLANT
TROCAR XCEL NON-BLD 11X100MML (ENDOMECHANICALS) ×2 IMPLANT

## 2021-07-21 NOTE — Op Note (Signed)
PATIENT:  Caitlin Ingram  40 y.o. female  PRE-OPERATIVE DIAGNOSIS:  chronic cholecystitis  POST-OPERATIVE DIAGNOSIS:  chronic cholecystitis  PROCEDURE:  Procedure(s): LAPAROSCOPIC CHOLECYSTECTOMY   SURGEON:  Aubreigh Fuerte, Arta Bruce, MD   ASSISTANT: none  ANESTHESIA:   local and general  Indications for procedure: [name] is a 40 y.o. female with symptoms of Abdominal pain and Nausea and vomiting consistent with gallbladder disease, Confirmed by ultrasound.  Description of procedure: The patient was brought into the operative suite, placed supine. Anesthesia was administered with endotracheal tube. Patient was strapped in place and foot board was secured. All pressure points were offloaded by foam padding. The patient was prepped and draped in the usual sterile fashion.  A periumbilical incision was made and optical entry was used to enter the abdomen. 2 5 mm trocars were placed on in the right lateral space on in the right subcostal space. A 64m trocar was placed in the subxiphoid space. Marcaine was infused to the subxiphoid space and lateral upper right abdomen in the transversus abdominis plane. Next the patient was placed in reverse trendelenberg. The gallbladder appearedchronically inflamed. Omentum was adhered to the gallbladder and was taken down with cautery/blunt dissection.  The gallbladder was retracted cephalad and lateral. The peritoneum was reflected off the infundibulum working lateral to medial. The cystic duct and cystic artery were identified and further dissection revealed a critical view. The cystic duct and cystic artery were doubly clipped and ligated.   The gallbladder was removed off the liver bed with cautery. The Gallbladder was placed in a specimen bag. The gallbladder fossa was irrigated and hemostasis was applied with cautery. The gallbladder was removed via the 156mtrocar. No dilation was required for removal, therefore no fascial closure was  performed. Pneumoperitoneum was removed, all trocar were removed. All incisions were closed with 4-0 monocryl subcuticular stitch. The patient woke from anesthesia and was brought to PACU in stable condition. All counts were correct  Findings: chronic cholecystitis  Specimen: gallbladder  Blood loss: 10 ml  Local anesthesia: 30 ml Marcaine  Complications: none  PLAN OF CARE: Discharge to home after PACU  PATIENT DISPOSITION:  PACU - hemodynamically stable.  LuHillsborourgery, PA[place]

## 2021-07-21 NOTE — Anesthesia Preprocedure Evaluation (Signed)
Anesthesia Evaluation  Patient identified by MRN, date of birth, ID band Patient awake    Reviewed: Allergy & Precautions, NPO status , Patient's Chart, lab work & pertinent test results  Airway Mallampati: II  TM Distance: >3 FB Neck ROM: Full    Dental  (+) Teeth Intact   Pulmonary neg pulmonary ROS, former smoker,    Pulmonary exam normal        Cardiovascular negative cardio ROS   Rhythm:Regular Rate:Normal     Neuro/Psych negative neurological ROS  negative psych ROS   GI/Hepatic (+)     substance abuse  marijuana use, Chronic cholecystitis    Endo/Other  negative endocrine ROS  Renal/GU Renal diseaseHistory of tones  negative genitourinary   Musculoskeletal negative musculoskeletal ROS (+)   Abdominal (+)  Abdomen: soft. Bowel sounds: normal.  Peds  Hematology negative hematology ROS (+)   Anesthesia Other Findings   Reproductive/Obstetrics                             Anesthesia Physical Anesthesia Plan  ASA: 2  Anesthesia Plan: General   Post-op Pain Management:    Induction: Intravenous  PONV Risk Score and Plan: 3 and Ondansetron, Dexamethasone, Midazolam and Treatment may vary due to age or medical condition  Airway Management Planned: Mask and Oral ETT  Additional Equipment: None  Intra-op Plan:   Post-operative Plan: Extubation in OR  Informed Consent: I have reviewed the patients History and Physical, chart, labs and discussed the procedure including the risks, benefits and alternatives for the proposed anesthesia with the patient or authorized representative who has indicated his/her understanding and acceptance.     Dental advisory given  Plan Discussed with: CRNA  Anesthesia Plan Comments: (Lab Results      Component                Value               Date                      WBC                      7.3                 07/14/2021                 HGB                      12.7                07/14/2021                HCT                      39.2                07/14/2021                MCV                      91.2                07/14/2021                PLT  214                 07/14/2021           Lab Results      Component                Value               Date                      NA                       135                 05/30/2021                K                        3.8                 05/30/2021                CO2                      23                  05/30/2021                GLUCOSE                  86                  05/30/2021                BUN                      11                  05/30/2021                CREATININE               0.91                05/30/2021                CALCIUM                  9.3                 05/30/2021                GFRNONAA                 >60                 05/30/2021                GFRAA                    102                 06/21/2019          )        Anesthesia Quick Evaluation

## 2021-07-21 NOTE — Anesthesia Postprocedure Evaluation (Signed)
Anesthesia Post Note  Patient: Caitlin Ingram  Procedure(s) Performed: LAPAROSCOPIC CHOLECYSTECTOMY (Abdomen)     Patient location during evaluation: PACU Anesthesia Type: General Level of consciousness: awake and alert and oriented Pain management: pain level controlled Vital Signs Assessment: post-procedure vital signs reviewed and stable Respiratory status: spontaneous breathing, nonlabored ventilation and respiratory function stable Cardiovascular status: blood pressure returned to baseline and stable Postop Assessment: no apparent nausea or vomiting Anesthetic complications: no Comments: Patient given Labetalol in PACU for HTN.    No notable events documented.  Last Vitals:  Vitals:   07/21/21 1415 07/21/21 1420  BP: (!) 174/107 (!) 172/102  Pulse: 69 74  Resp: 13 19  Temp:    SpO2: 100% 100%    Last Pain:  Vitals:   07/21/21 1345  TempSrc:   PainSc: 4                  Vishruth Seoane A.

## 2021-07-21 NOTE — Transfer of Care (Signed)
Immediate Anesthesia Transfer of Care Note  Patient: Caitlin Ingram  Procedure(s) Performed: LAPAROSCOPIC CHOLECYSTECTOMY (Abdomen)  Patient Location: PACU  Anesthesia Type:General  Level of Consciousness: sedated, patient cooperative and responds to stimulation  Airway & Oxygen Therapy: Patient Spontanous Breathing and Patient connected to face mask oxygen  Post-op Assessment: Report given to RN and Post -op Vital signs reviewed and stable  Post vital signs: Reviewed and stable  Last Vitals:  Vitals Value Taken Time  BP 141/117 07/21/21 1157  Temp    Pulse 95 07/21/21 1158  Resp 21 07/21/21 1158  SpO2 100 % 07/21/21 1158  Vitals shown include unvalidated device data.  Last Pain:  Vitals:   07/21/21 1015  TempSrc: Oral         Complications: No notable events documented.

## 2021-07-21 NOTE — Anesthesia Procedure Notes (Signed)
Procedure Name: Intubation Date/Time: 07/21/2021 11:37 AM Performed by: Gean Maidens, CRNA Pre-anesthesia Checklist: Patient identified, Emergency Drugs available, Suction available, Patient being monitored and Timeout performed Patient Re-evaluated:Patient Re-evaluated prior to induction Oxygen Delivery Method: Circle system utilized Preoxygenation: Pre-oxygenation with 100% oxygen Induction Type: IV induction Ventilation: Mask ventilation without difficulty Laryngoscope Size: Mac and 4 Grade View: Grade I Tube type: Oral Tube size: 7.0 mm Number of attempts: 1 Airway Equipment and Method: Stylet Placement Confirmation: ETT inserted through vocal cords under direct vision, positive ETCO2 and breath sounds checked- equal and bilateral Secured at: 21 cm Tube secured with: Tape Dental Injury: Teeth and Oropharynx as per pre-operative assessment

## 2021-07-21 NOTE — H&P (Signed)
Caitlin Ingram is an 40 y.o. female.   Chief Complaint: gallstones HPI: 40 yo female with small stones. She continues to having epigastric pain despite eating bland low fat foods.  Past Medical History:  Diagnosis Date   Biliary sludge    Carcinoid tumor of colon 01/18/2020   fully resected   Chronic constipation    History of kidney stones    Hyperplastic colon polyp    Kidney stones    Renal disorder    renal calculi    Past Surgical History:  Procedure Laterality Date   BUNIONECTOMY Left    CYST REMOVAL NECK  1990    Family History  Problem Relation Age of Onset   Hypertension Mother    Hypertension Father    Diabetes Brother    Colon cancer Neg Hx    Esophageal cancer Neg Hx    Pancreatic cancer Neg Hx    Stomach cancer Neg Hx    Social History:  reports that she has quit smoking. Her smoking use included cigars and cigarettes. She has a 1.25 pack-year smoking history. She has never used smokeless tobacco. She reports current alcohol use. She reports current drug use. Drug: Marijuana.  Allergies:  Allergies  Allergen Reactions   Penicillins Hives and Itching   Amoxicillin Hives    Medications Prior to Admission  Medication Sig Dispense Refill   acetaminophen (TYLENOL) 500 MG tablet Take 500-1,000 mg by mouth every 6 (six) hours as needed (for headaches).     dicyclomine (BENTYL) 20 MG tablet Take 1 tablet (20 mg total) by mouth 2 (two) times daily. (Patient not taking: Reported on 07/10/2021) 20 tablet 0   HYDROcodone-acetaminophen (NORCO/VICODIN) 5-325 MG tablet Take 1 tablet by mouth every 6 (six) hours as needed for severe pain. 15 tablet 0   ibuprofen (ADVIL) 800 MG tablet Take 800 mg by mouth every 8 (eight) hours as needed (for pain.).     Plecanatide (TRULANCE) 3 MG TABS Take 1 tablet by mouth daily. Pharmacy-please d/c rx for motegrity. Insurance will not cover (Patient not taking: Reported on 07/10/2021) 30 tablet 2   sennosides-docusate sodium  (SENOKOT-S) 8.6-50 MG tablet Take 2 tablets by mouth in the morning and at bedtime. Take 2 tablets by mouth with breakfast and evening meal      No results found for this or any previous visit (from the past 48 hour(s)). No results found.  Review of Systems  Constitutional:  Negative for chills and fever.  HENT:  Negative for hearing loss.   Respiratory:  Negative for cough.   Cardiovascular:  Negative for chest pain and palpitations.  Gastrointestinal:  Negative for abdominal pain, nausea and vomiting.  Genitourinary:  Negative for dysuria and urgency.  Musculoskeletal:  Negative for myalgias and neck pain.  Skin:  Negative for rash.  Neurological:  Negative for dizziness and headaches.  Hematological:  Does not bruise/bleed easily.  Psychiatric/Behavioral:  Negative for suicidal ideas.    Blood pressure (!) 143/91, pulse 73, temperature 98.4 F (36.9 C), temperature source Oral, resp. rate 16, last menstrual period 06/28/2021, SpO2 100 %. Physical Exam Vitals reviewed.  Constitutional:      Appearance: She is well-developed.  HENT:     Head: Normocephalic and atraumatic.  Eyes:     Conjunctiva/sclera: Conjunctivae normal.     Pupils: Pupils are equal, round, and reactive to light.  Cardiovascular:     Rate and Rhythm: Normal rate and regular rhythm.  Pulmonary:     Effort:  Pulmonary effort is normal.     Breath sounds: Normal breath sounds.  Abdominal:     General: Bowel sounds are normal. There is no distension.     Palpations: Abdomen is soft.     Tenderness: There is no abdominal tenderness.  Musculoskeletal:        General: Normal range of motion.     Cervical back: Normal range of motion and neck supple.  Skin:    General: Skin is warm and dry.  Neurological:     Mental Status: She is alert and oriented to person, place, and time.  Psychiatric:        Behavior: Behavior normal.    Assessment/Plan 40 yo female with gallstones -lap chole -[facility]AS  protocol -outpatient procedure  Mickeal Skinner, MD 07/21/2021, 10:24 AM

## 2021-07-21 NOTE — Discharge Instructions (Signed)
[facility] ______[facility] LAPAROSCOPIC SURGERY: POST OP INSTRUCTIONS Always review your discharge instruction sheet given to you by the facility where your surgery was performed. IF YOU HAVE DISABILITY OR FAMILY LEAVE FORMS, YOU MUST BRING THEM TO THE OFFICE FOR PROCESSING.   DO NOT GIVE THEM TO YOUR DOCTOR.  A prescription for pain medication may be given to you upon discharge.  Take your pain medication as prescribed, if needed.  If narcotic pain medicine is not needed, then you may take acetaminophen (Tylenol) or ibuprofen (Advil) as needed. Take your usually prescribed medications unless otherwise directed. If you need a refill on your pain medication, please contact your pharmacy.  They will contact our office to request authorization. Prescriptions will not be filled after 5pm or on week-ends. You should follow a light diet the first few days after arrival home, such as soup and crackers, etc.  Be sure to include lots of fluids daily. Most patients will experience some swelling and bruising in the area of the incisions.  Ice packs will help.  Swelling and bruising can take several days to resolve.  It is common to experience some constipation if taking pain medication after surgery.  Increasing fluid intake and taking a stool softener (such as Colace) will usually help or prevent this problem from occurring.  A mild laxative (Milk of Magnesia or Miralax) should be taken according to package instructions if there are no bowel movements after 48 hours. Unless discharge instructions indicate otherwise, you may remove your bandages 24-48 hours after surgery, and you may shower at that time.  You may have steri-strips (small skin tapes) in place directly over the incision.  These strips should be left on the skin for 7-10 days.  If your surgeon used skin glue on the incision, you may shower in 24 hours.  The glue will flake off over the next 2-3 weeks.  Any sutures or staples will be  removed at the office during your follow-up visit. ACTIVITIES:  You may resume regular (light) daily activities beginning the next day--such as daily self-care, walking, climbing stairs--gradually increasing activities as tolerated.  You may have sexual intercourse when it is comfortable.  Refrain from any heavy lifting or straining until approved by your doctor. You may drive when you are no longer taking prescription pain medication, you can comfortably wear a seatbelt, and you can safely maneuver your car and apply brakes. RETURN TO WORK:  __________________________________________________________ You should see your doctor in the office for a follow-up appointment approximately 2-3 weeks after your surgery.  Make sure that you call for this appointment within a day or two after you arrive home to insure a convenient appointment time. OTHER INSTRUCTIONS: __________________________________________________________________________________________________________________________ __________________________________________________________________________________________________________________________ WHEN TO CALL YOUR DOCTOR: Fever over 101.0 Inability to urinate Continued bleeding from incision. Increased pain, redness, or drainage from the incision. Increasing abdominal pain  The clinic staff is available to answer your questions during regular business hours.  Please don't hesitate to call and ask to speak to one of the nurses for clinical concerns.  If you have a medical emergency, go to the nearest emergency room or call 911.  A surgeon from [facility] is always on call at the hospital. [address], [address], [place], [place]  [address] ? [address] [address], [place], [place]   [address] [phone] ? [phone] ? FAX [phone] Web site: [email or web address]  

## 2021-07-22 ENCOUNTER — Ambulatory Visit: Payer: 59 | Admitting: Internal Medicine

## 2021-07-22 ENCOUNTER — Encounter (HOSPITAL_COMMUNITY): Payer: Self-pay | Admitting: General Surgery

## 2021-07-22 LAB — SURGICAL PATHOLOGY

## 2022-01-05 ENCOUNTER — Telehealth: Payer: Self-pay | Admitting: Internal Medicine

## 2022-01-05 NOTE — Telephone Encounter (Signed)
Left message on machine to call back  

## 2022-01-05 NOTE — Telephone Encounter (Signed)
Patient called asking what she would need to get a referral to see another Gi provider outside of [facility]. I told her she would need to see her PCP and she declined that option. Stated she has not received any relief from her symptoms and she is seeking further help.

## 2022-01-13 NOTE — Telephone Encounter (Signed)
Pt never returned phone call 

## 2022-01-23 IMAGING — CT CT ABD-PELV W/ CM
2 of 4 series · 17 of 46 positions shown, 19 images · IV contrast (omnipaque)
Comparison: None.

CLINICAL DATA: Diverticulitis suspected, acute on chronic abdominal
pain and nausea

EXAM:
CT ABDOMEN AND PELVIS WITH CONTRAST
TECHNIQUE: Multidetector CT imaging of the abdomen and pelvis was performed
using the standard protocol following bolus administration of
intravenous contrast.
CONTRAST:  100mL OMNIPAQUE IOHEXOL 300 MG/ML  SOLN

[Series 3: a/p w/ 5mm · axial · 0.98mm/px · z∈[+918,+1353]mm · 14 of 95 slices shown, 16 images]
[im 4/95  soft-tissue]
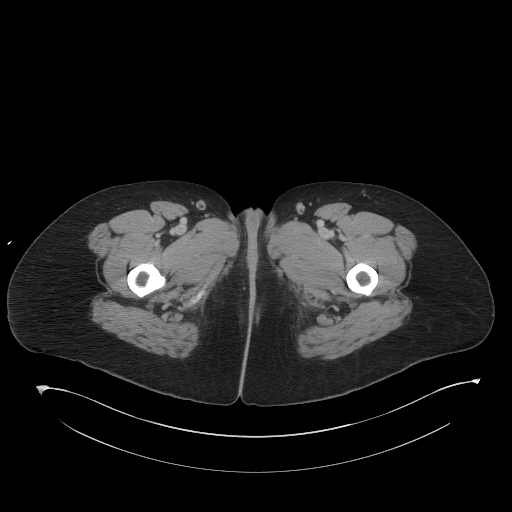
[im 4/95  bone]
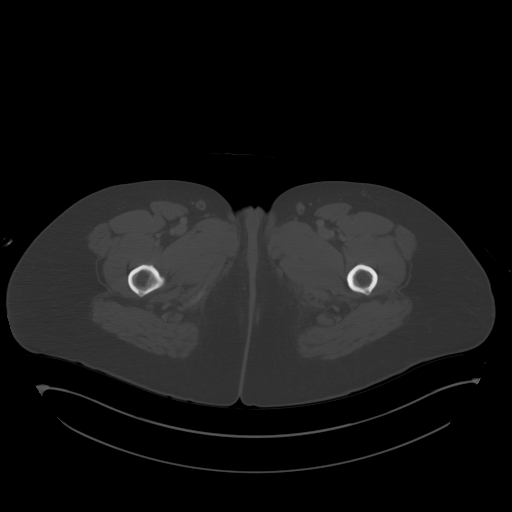
[im 12/95  soft-tissue]
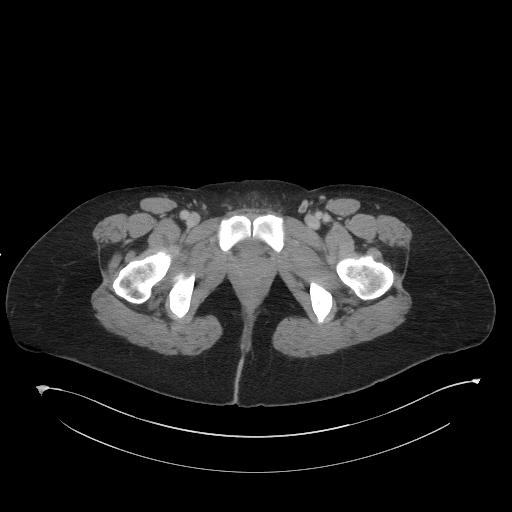
[im 19/95  soft-tissue]
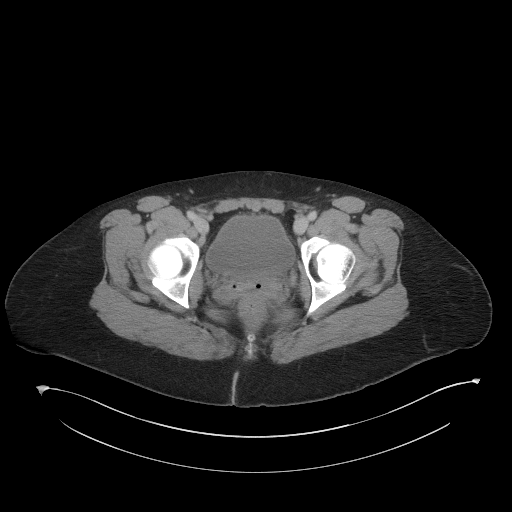
[im 27/95  soft-tissue]
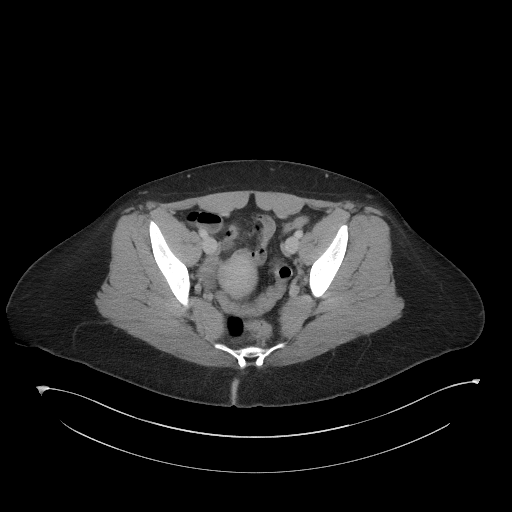
[im 31/95  soft-tissue]
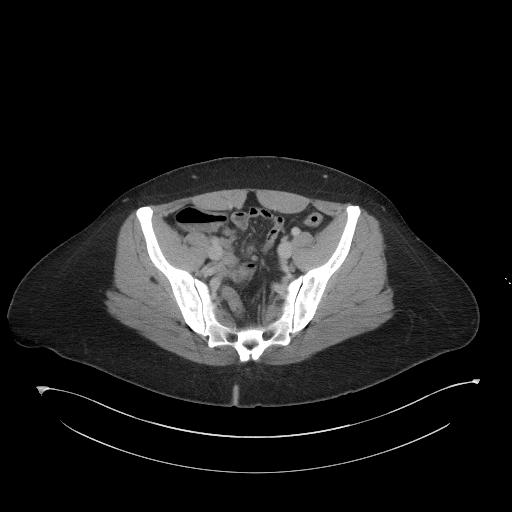
[im 38/95  soft-tissue]
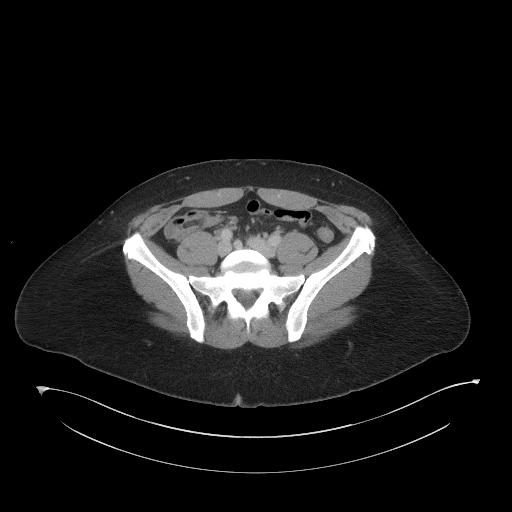
[im 46/95  soft-tissue]
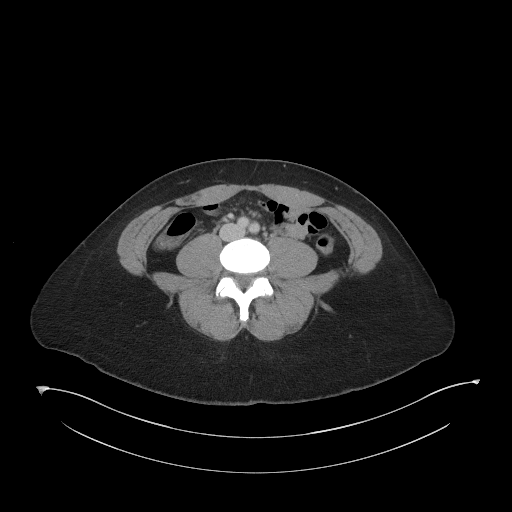
[im 49/95  soft-tissue]
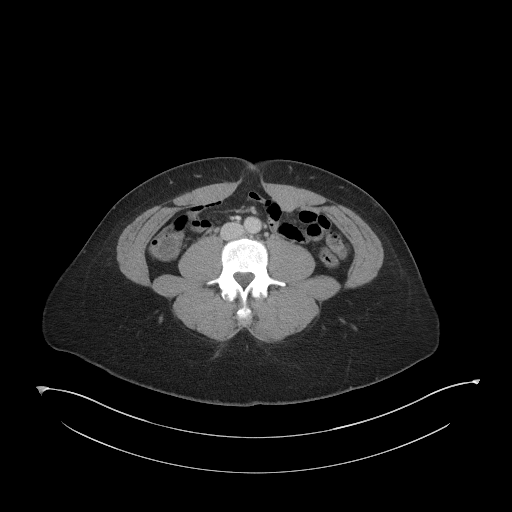
[im 57/95  soft-tissue]
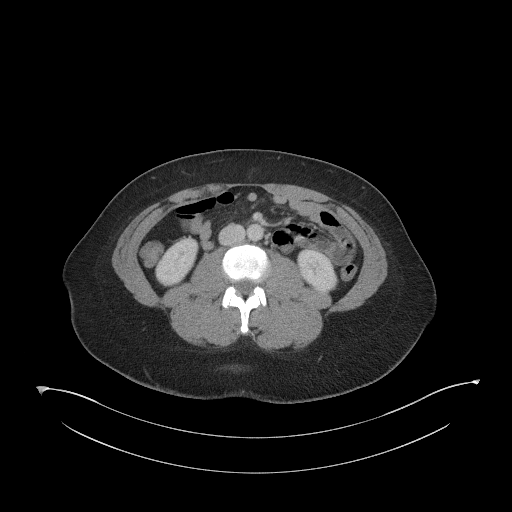
[im 57/95  bone]
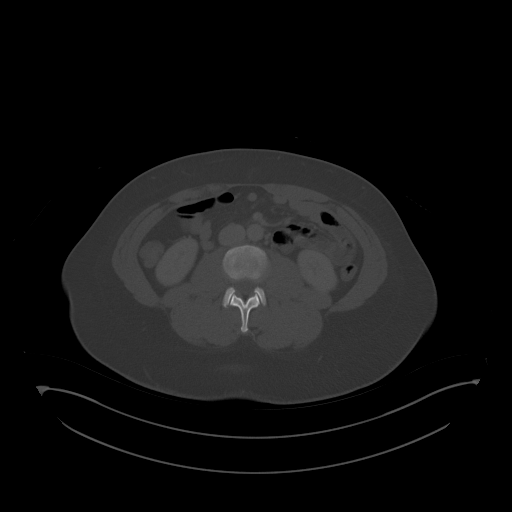
[im 64/95  soft-tissue]
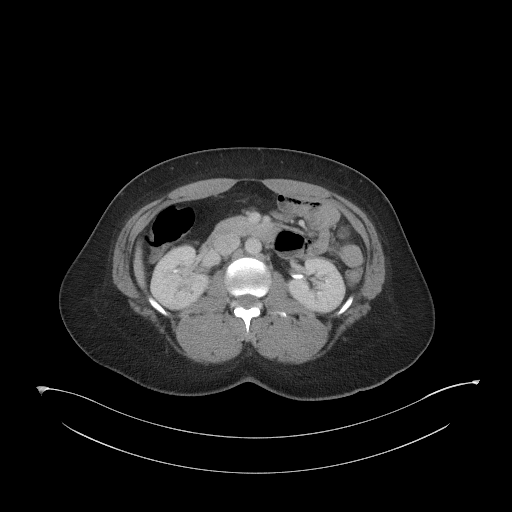
[im 72/95  soft-tissue]
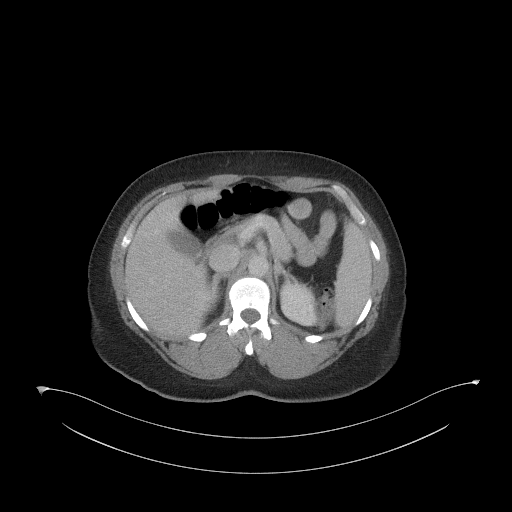
[im 76/95  soft-tissue]
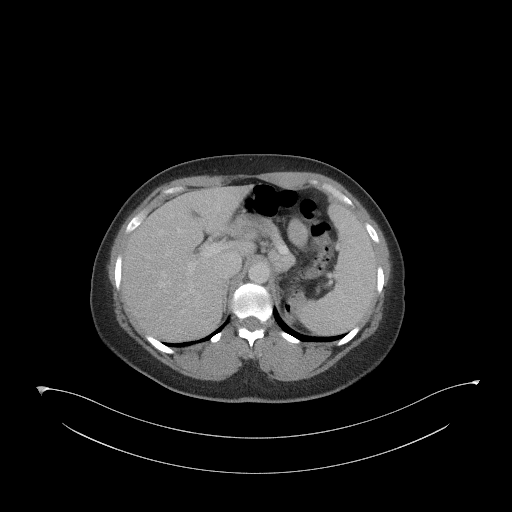
[im 83/95  soft-tissue]
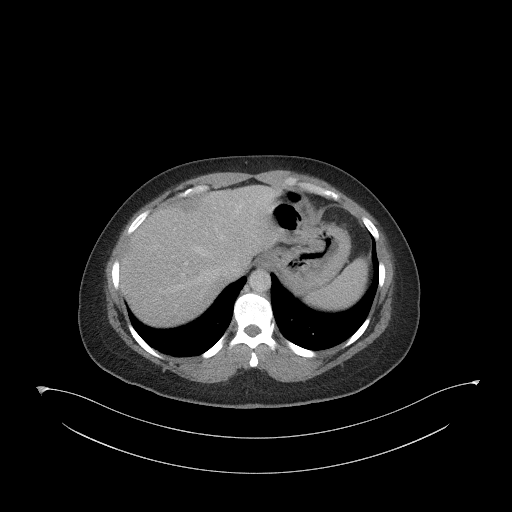
[im 91/95  soft-tissue]
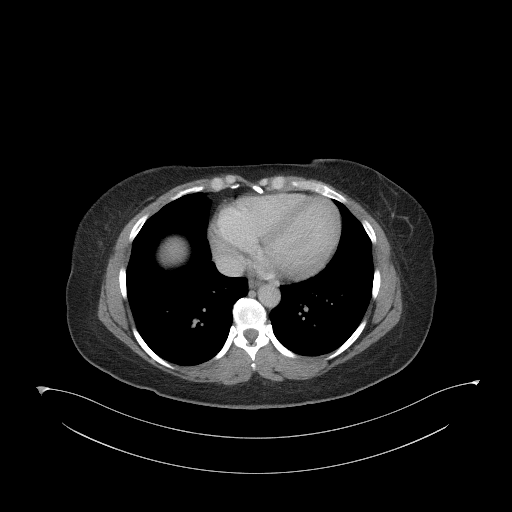

[Series 6: a/p w/ cor · coronal · 0.90mm/px · 3 of 128 slices shown]
[im 43/128  soft-tissue]
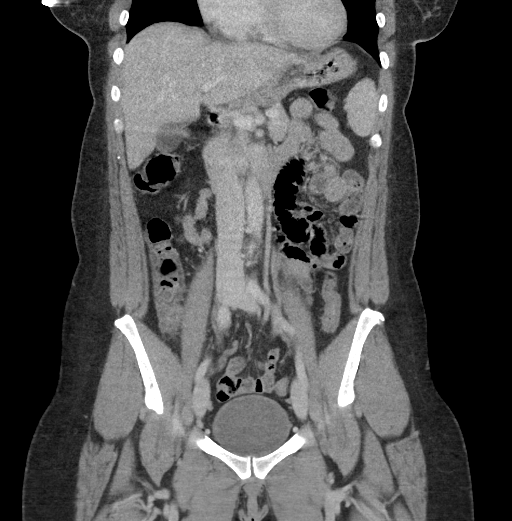
[im 57/128  soft-tissue]
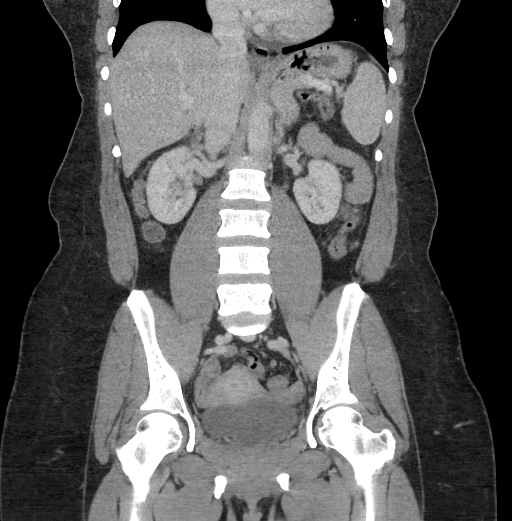
[im 71/128  soft-tissue]
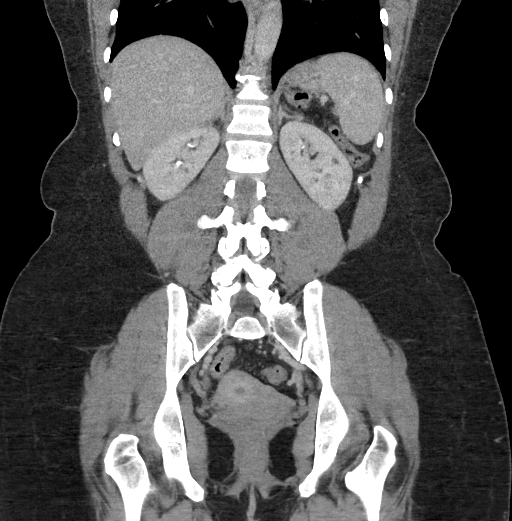

[17 of 46 positions shown; findings below may reference images not displayed]

FINDINGS: Lower chest: No acute abnormality.

Hepatobiliary: No solid liver abnormality is seen. No gallstones,
gallbladder wall thickening, or biliary dilatation.

Pancreas: Unremarkable. No pancreatic ductal dilatation or
surrounding inflammatory changes.

Spleen: Normal in size without significant abnormality.

Adrenals/Urinary Tract: Adrenal glands are unremarkable. Kidneys are
normal, without renal calculi, solid lesion, or hydronephrosis.
Bladder is unremarkable.

Stomach/Bowel: Stomach is within normal limits. Appendix appears
normal. No evidence of bowel wall thickening, distention, or
inflammatory changes.

Vascular/Lymphatic: No significant vascular findings are present. No
enlarged abdominal or pelvic lymph nodes.

Reproductive: No mass or other significant abnormality. Small
bilateral benign, functional cysts and follicles of the ovaries.

Other: Small, fat containing umbilical hernia. No abdominopelvic
ascites.

Musculoskeletal: No acute or significant osseous findings.
IMPRESSION: 1. No CT findings of the abdomen or pelvis to explain nausea or
pain.
2. No evidence of acute diverticulitis. No significant diverticular
disease of the colon.

## 2022-02-11 IMAGING — US US ABDOMEN LIMITED
1 series · 14 of 25 positions shown · non-contrast
Comparison: May 30, 2021

CLINICAL DATA: Abdominal pain, RUQ.  US GALLBLADDER

EXAM:
ULTRASOUND ABDOMEN LIMITED RIGHT UPPER QUADRANT

[Series 1: us abdomen limited · 0.19mm/px · 14 of 56 slices shown]
[im 1/56]
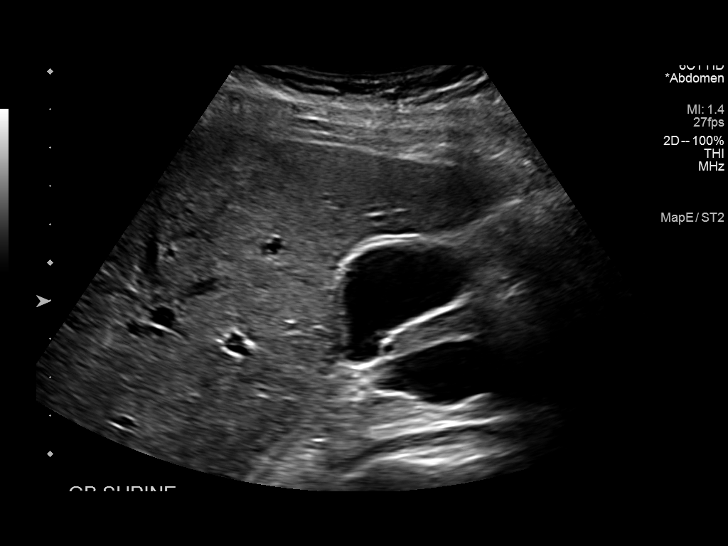
[im 5/56]
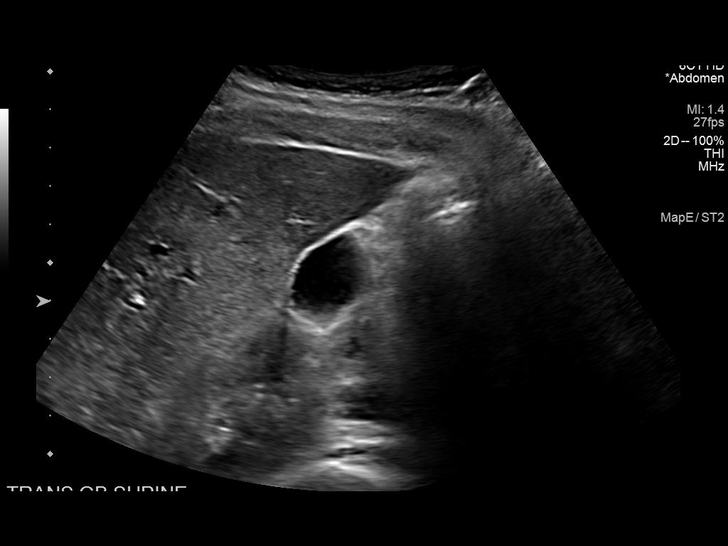
[im 10/56]
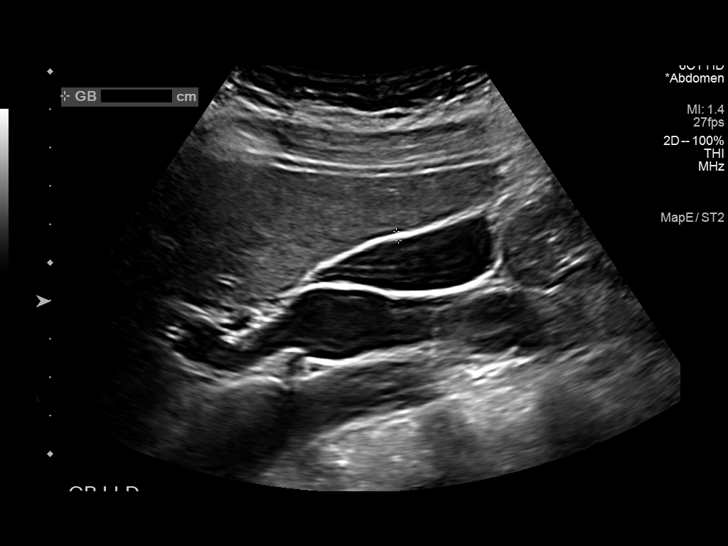
[im 14/56]
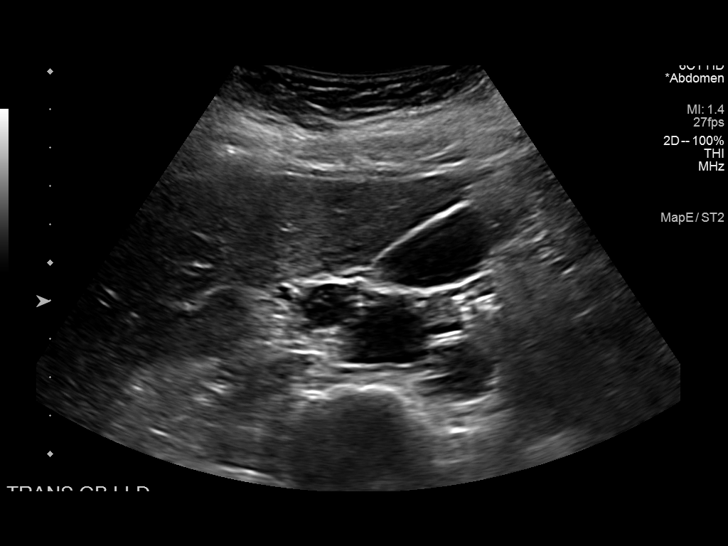
[im 19/56]
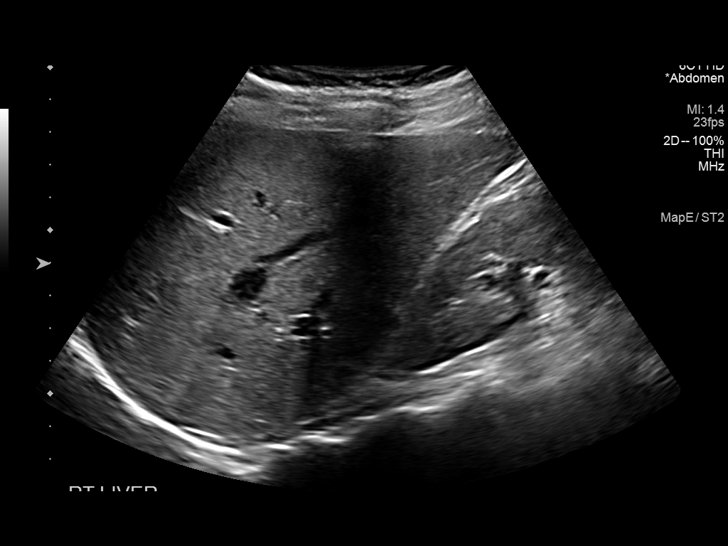
[im 21/56]
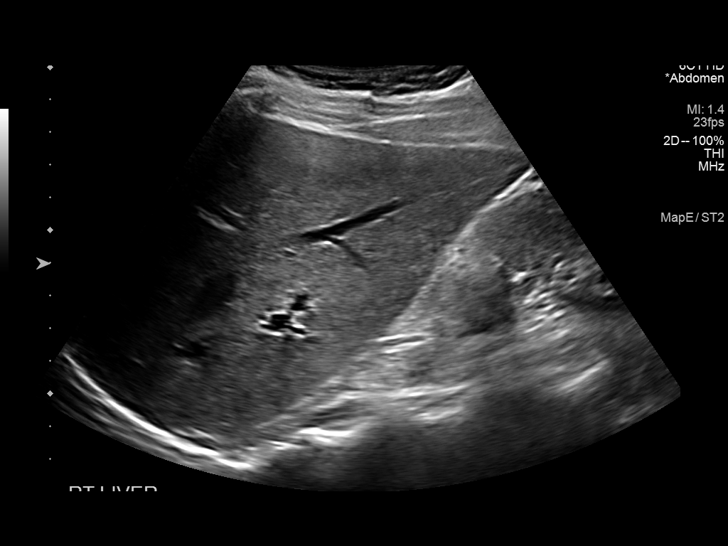
[im 26/56]
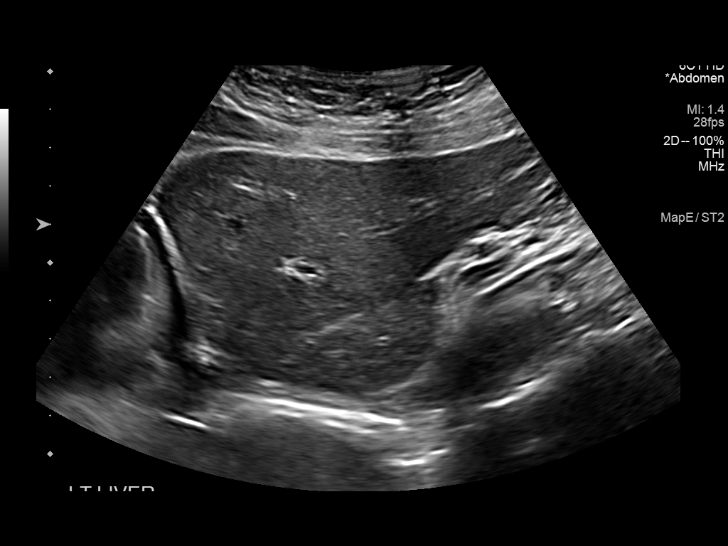
[im 30/56]
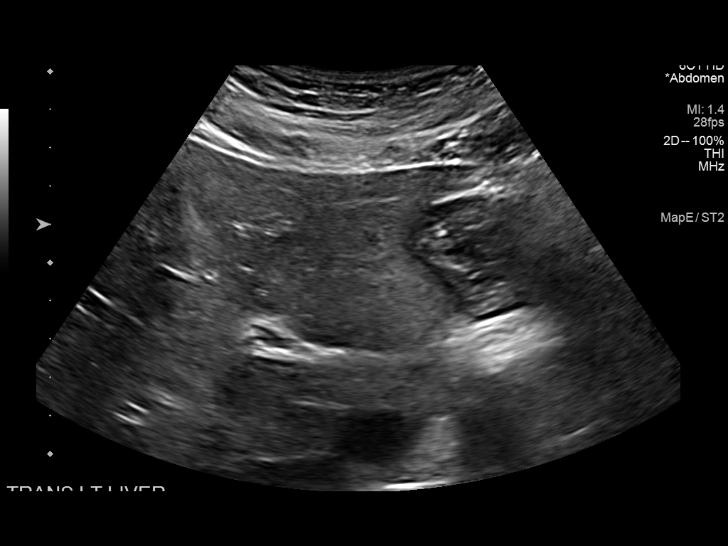
[im 35/56]
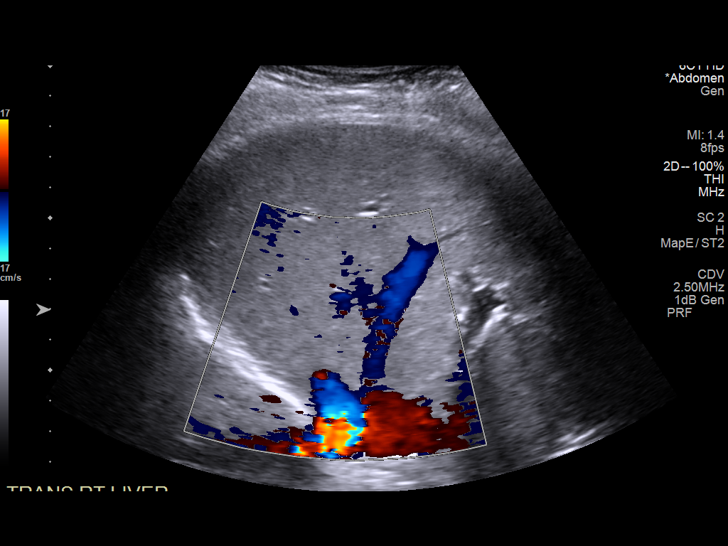
[im 37/56]
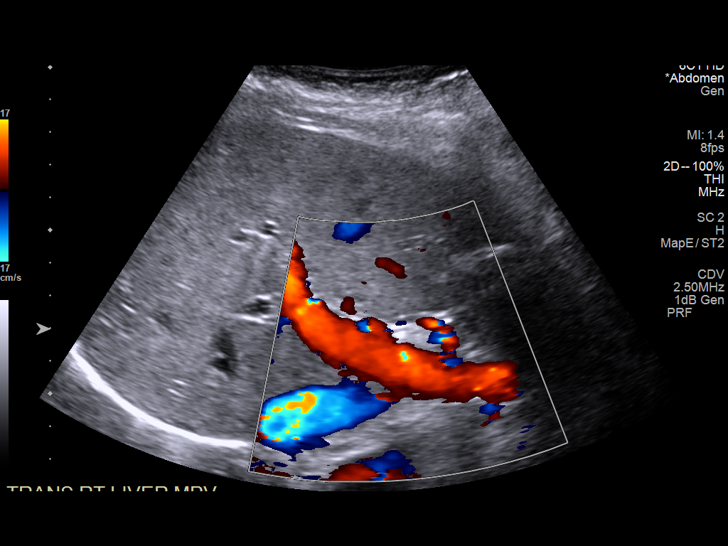
[im 42/56]
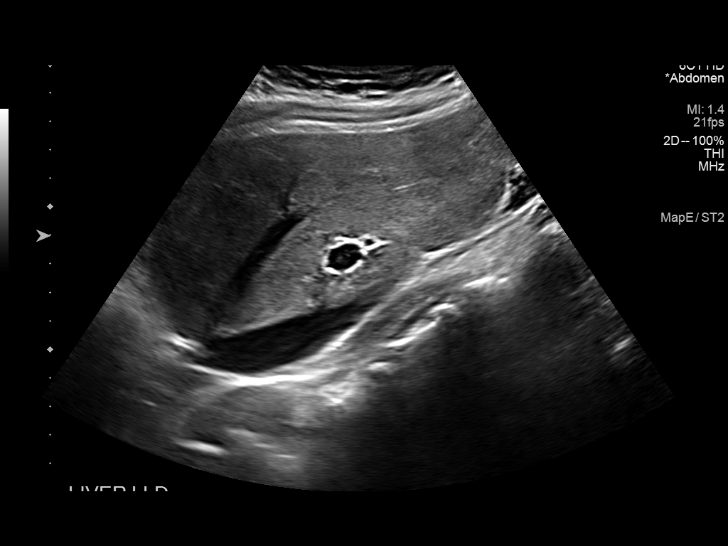
[im 46/56]
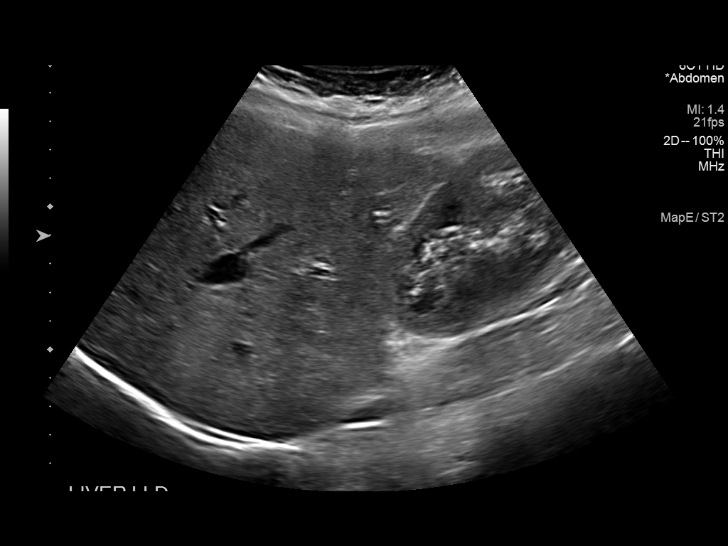
[im 51/56]
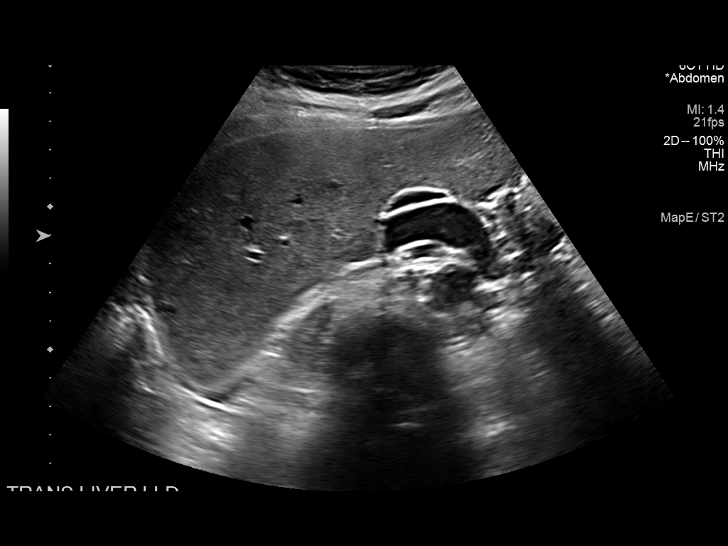
[im 56/56]
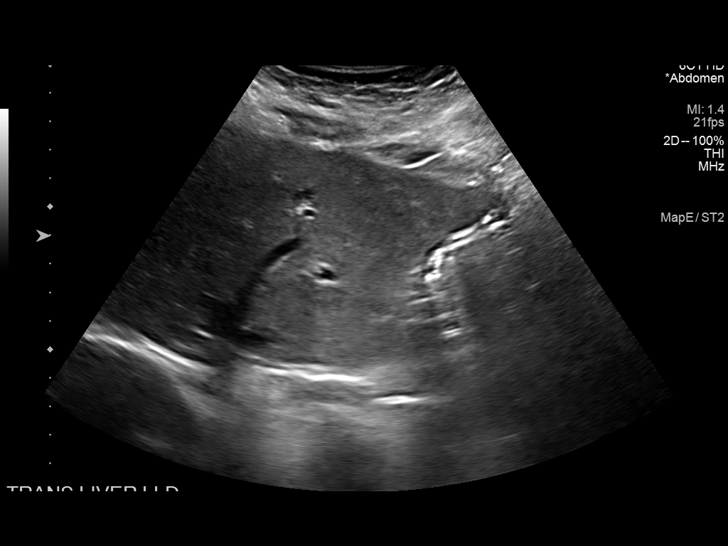

[14 of 25 positions shown; findings below may reference images not displayed]

FINDINGS: Gallbladder:

No wall thickening visualized. There is a small amount of sludge. No
sonographic Murphy sign noted by sonographer.

Common bile duct:

Diameter: 5 mm, normal

Liver:

No focal lesion identified. Within normal limits in parenchymal
echogenicity. Portal vein is patent on color Doppler imaging with
normal direction of blood flow towards the liver.

Other: None.
IMPRESSION: Small amount of biliary sludge.  No evidence of acute cholecystitis.

## 2022-08-06 ENCOUNTER — Emergency Department (HOSPITAL_COMMUNITY)
Admission: EM | Admit: 2022-08-06 | Discharge: 2022-08-07 | Discharge status: Against Medical Advice | Payer: 59 | Attending: Student | Admitting: Student

## 2022-08-06 ENCOUNTER — Encounter (HOSPITAL_COMMUNITY): Payer: Self-pay | Admitting: Emergency Medicine

## 2022-08-06 ENCOUNTER — Other Ambulatory Visit: Payer: Self-pay

## 2022-08-06 DIAGNOSIS — Z5321 Procedure and treatment not carried out due to patient leaving prior to being seen by health care provider: Secondary | ICD-10-CM | POA: Insufficient documentation

## 2022-08-06 DIAGNOSIS — R1084 Generalized abdominal pain: Secondary | ICD-10-CM | POA: Insufficient documentation

## 2022-08-06 LAB — CBC WITH DIFFERENTIAL/PLATELET
Abs Immature Granulocytes: 0.01 10*3/uL (ref 0.00–0.07)
Basophils Absolute: 0 10*3/uL (ref 0.0–0.1)
Basophils Relative: 1 %
Eosinophils Absolute: 0.1 10*3/uL (ref 0.0–0.5)
Eosinophils Relative: 1 %
HCT: 39.9 % (ref 36.0–46.0)
Hemoglobin: 13.4 g/dL (ref 12.0–15.0)
Immature Granulocytes: 0 %
Lymphocytes Relative: 48 %
Lymphs Abs: 2.7 10*3/uL (ref 0.7–4.0)
MCH: 28 pg (ref 26.0–34.0)
MCHC: 33.6 g/dL (ref 30.0–36.0)
MCV: 83.5 fL (ref 80.0–100.0)
Monocytes Absolute: 0.7 10*3/uL (ref 0.1–1.0)
Monocytes Relative: 13 %
Neutro Abs: 2 10*3/uL (ref 1.7–7.7)
Neutrophils Relative %: 37 %
Platelets: 207 10*3/uL (ref 150–400)
RBC: 4.78 MIL/uL (ref 3.87–5.11)
RDW: 13.6 % (ref 11.5–15.5)
WBC: 5.5 10*3/uL (ref 4.0–10.5)
nRBC: 0 % (ref 0.0–0.2)

## 2022-08-06 LAB — URINALYSIS, ROUTINE W REFLEX MICROSCOPIC
Bilirubin Urine: NEGATIVE
Glucose, UA: NEGATIVE mg/dL
Hgb urine dipstick: NEGATIVE
Ketones, ur: NEGATIVE mg/dL
Leukocytes,Ua: NEGATIVE
Nitrite: NEGATIVE
Protein, ur: NEGATIVE mg/dL
Specific Gravity, Urine: 1.017 (ref 1.005–1.030)
pH: 5 (ref 5.0–8.0)

## 2022-08-06 LAB — COMPREHENSIVE METABOLIC PANEL
ALT: 9 U/L (ref 0–44)
AST: 20 U/L (ref 15–41)
Albumin: 3.7 g/dL (ref 3.5–5.0)
Alkaline Phosphatase: 76 U/L (ref 38–126)
Anion gap: 10 (ref 5–15)
BUN: 6 mg/dL (ref 6–20)
CO2: 27 mmol/L (ref 22–32)
Calcium: 9.2 mg/dL (ref 8.9–10.3)
Chloride: 100 mmol/L (ref 98–111)
Creatinine, Ser: 0.77 mg/dL (ref 0.44–1.00)
GFR, Estimated: >60 mL/min (ref >60)
Glucose, Bld: 111 mg/dL — ABNORMAL HIGH (ref 70–99)
Potassium: 3 mmol/L — ABNORMAL LOW (ref 3.5–5.1)
Sodium: 137 mmol/L (ref 135–145)
Total Bilirubin: 0.4 mg/dL (ref 0.3–1.2)
Total Protein: 6.9 g/dL (ref 6.5–8.1)

## 2022-08-06 LAB — I-STAT BETA HCG BLOOD, ED (MC, WL, AP ONLY): I-stat hCG, quantitative: <5 m[IU]/mL (ref <5)

## 2022-08-06 LAB — LIPASE, BLOOD: Lipase: 27 U/L (ref 11–51)

## 2022-08-06 NOTE — ED Provider Triage Note (Signed)
Emergency Medicine Provider Triage Evaluation Note  Caitlin Ingram , a 42 y.o. female  was evaluated in triage.  Pt complains of abdominal pain x1 week.  It is constant, dull across her abdomen and radiates to the flanks bilaterally.  No nausea or vomiting.  Decreased amount of bowel movements.  History of cholecystectomy but denies any other abdominal surgeries.  States has been going on for a year but worsened in the last week..  Review of Systems  Per HPI  Physical Exam  BP 120/85 (BP Location: Left Arm)   Pulse 94   Temp 98 F (36.7 C)   Resp 18   SpO2 97%  Gen:   Awake, no distress   Resp:  Normal effort  MSK:   Moves extremities without difficulty  Other:  Generalized abdominal tenderness  Medical Decision Making  Medically screening exam initiated at 7:16 PM.  Appropriate orders placed.  Caitlin Ingram was informed that the remainder of the evaluation will be completed by another provider, this initial triage assessment does not replace that evaluation, and the importance of remaining in the ED until their evaluation is complete.     Sherrill Raring, PA-C 08/06/22 1916

## 2022-08-06 NOTE — ED Triage Notes (Signed)
Pt reported to ED with c/o generalized abdominal pain and bloating. Pt sates she had a slight bowel movement yesterday but states "I AM NOT CONSTIPATED" and states that there is a different issue that has been ongoing for 3 years that is not constipation, pt states she takes senna supplement daily.

## 2022-08-07 NOTE — ED Notes (Signed)
CALLED PATIENT X2 NOT ANSWERING FOR RECHECK VITALSIGNS X1 AGAIN NO ANSWERING
